# Patient Record
Sex: Female | Born: 1990
Health system: Southern US, Community
[De-identification: ages and names within clinical notes are randomized; demographics above are authoritative.]

## PROBLEM LIST (undated history)

## (undated) DIAGNOSIS — R5383 Other fatigue: Secondary | ICD-10-CM

## (undated) DIAGNOSIS — E559 Vitamin D deficiency, unspecified: Secondary | ICD-10-CM

## (undated) HISTORY — DX: Other fatigue: R53.83

## (undated) HISTORY — PX: TONSILLECTOMY: SUR1361

## (undated) HISTORY — DX: Vitamin D deficiency, unspecified: E55.9

---

## 2017-08-02 LAB — HM PAP SMEAR: HM Pap smear: NEGATIVE

## 2017-11-11 ENCOUNTER — Ambulatory Visit (INDEPENDENT_AMBULATORY_CARE_PROVIDER_SITE_OTHER): Payer: No Typology Code available for payment source | Admitting: Physician Assistant

## 2017-11-11 ENCOUNTER — Encounter: Payer: Self-pay | Admitting: Physician Assistant

## 2017-11-11 VITALS — BP 113/74 | HR 76 | Ht 64.0 in | Wt 173.0 lb

## 2017-11-11 DIAGNOSIS — E559 Vitamin D deficiency, unspecified: Secondary | ICD-10-CM | POA: Insufficient documentation

## 2017-11-11 DIAGNOSIS — R5383 Other fatigue: Secondary | ICD-10-CM | POA: Diagnosis not present

## 2017-11-11 DIAGNOSIS — Z130 Encounter for screening for diseases of the blood and blood-forming organs and certain disorders involving the immune mechanism: Secondary | ICD-10-CM

## 2017-11-11 DIAGNOSIS — Z7689 Persons encountering health services in other specified circumstances: Secondary | ICD-10-CM

## 2017-11-11 DIAGNOSIS — Z1329 Encounter for screening for other suspected endocrine disorder: Secondary | ICD-10-CM

## 2017-11-11 DIAGNOSIS — L700 Acne vulgaris: Secondary | ICD-10-CM | POA: Insufficient documentation

## 2017-11-11 MED ORDER — MINOCYCLINE HCL 100 MG PO CAPS: 100.0000 mg | ORAL_CAPSULE | Freq: Two times a day (BID) | ORAL | 1 refills | Status: DC

## 2017-11-11 MED ORDER — TAZAROTENE 0.1 % EX GEL: Freq: Every day | CUTANEOUS | 3 refills | Status: DC

## 2017-11-11 NOTE — Patient Instructions (Signed)

## 2017-11-11 NOTE — Progress Notes (Signed)
HPI:                                                                Rhonda Rojas is a 27 y.o. female who presents to [facility] today to establish care  Current concerns: fatigue, acne  For several months has felt low energy, especially after work. Sleeps 8 hours per night, sometimes feels rested. Reports history of vitamin D deficiency. Denies history of anemia, but does report history of heavy periods for which she takes birth control. Denies depressed mood, anhedonia, anxiety. No fever, chills, nightsweats, unintended weight loss.  Does not currently exercise. Drinks 1 coffee and 1 green tea per day.  Works 30 hours per week and is full-time mom to 55 yo daughter and 71 yo son.  She also c/o facial acne. States she has tried antibiotics, oral and topical, in the past, but it did not help. She is currently only taking an OCP which is only mildly helpful. Requests referral to dermatology.  Depression screen PHQ 2/9 11/11/2017  Decreased Interest 0  Down, Depressed, Hopeless 0  PHQ - 2 Score 0    No flowsheet data found.    Past Medical History:  Diagnosis Date  . Fatigue   . Vitamin D deficiency    Past Surgical History:  Procedure Laterality Date  . TONSILLECTOMY     Social History   Tobacco Use  . Smoking status: Never Smoker  . Smokeless tobacco: Never Used  Substance Use Topics  . Alcohol use: Not Currently   family history includes Healthy in her father and mother.    ROS: negative except as noted in the HPI  Medications: Current Outpatient Medications  Medication Sig Dispense Refill  . TRI-SPRINTEC 0.18/0.215/0.25 MG-35 MCG tablet Take 1 tablet by mouth daily.  4  . minocycline (MINOCIN,DYNACIN) 100 MG capsule Take 1 capsule (100 mg total) by mouth 2 (two) times daily. 180 capsule 1  . tazarotene (TAZORAC) 0.1 % gel Apply topically at bedtime. 30 g 3   No current facility-administered medications  for this visit.    Not on File     Objective:  BP 113/74   Pulse 76   Ht 5\' 4"  (1.626 m)   Wt 173 lb (78.5 kg)   LMP 10/27/2017   BMI 29.70 kg/m  Gen:  alert, not ill-appearing, no distress, appropriate for age HEENT: head normocephalic without obvious abnormality, conjunctiva and cornea clear, trachea midline, thyroid not enlarged, no tenderness Pulm: Normal work of breathing, normal phonation, clear to auscultation bilaterally, no wheezes, rales or rhonchi CV: Normal rate, regular rhythm, s1 and s2 distinct, no murmurs, clicks or rubs  Neuro: alert and oriented x 3, no tremor MSK: extremities atraumatic, normal gait and station Skin: moderate cystic facial acne Psych: well-groomed, cooperative, good eye contact, euthymic mood, affect mood-congruent, speech is articulate, and thought processes clear and goal-directed    No results found for this or any previous visit (from the past 72 hour(s)). No results found.    Assessment and Plan: 27 y.o. female with   - Personally reviewed PMH, PSH, PFH, medications, allergies, HM - Age-appropriate cancer screening: Pap UTD, requesting records from [facility] - Influenza UTD per patient - Tdap per patient -  PHQ2 negative  .Alyene was seen today for establish care.  Diagnoses and all orders for this visit:  Encounter to establish care  Vitamin D deficiency -     Vit D  25 hydroxy (rtn osteoporosis monitoring)  Fatigue, unspecified type -     CBC -     Comprehensive metabolic panel -     Ferritin -     TSH -     Vit D  25 hydroxy (rtn osteoporosis monitoring)  Screening for thyroid disorder -     TSH  Screening for blood disease -     CBC -     Comprehensive metabolic panel -     Ferritin  Cystic acne vulgaris -     Discontinue: minocycline (MINOCIN,DYNACIN) 100 MG capsule; Take 1 capsule (100 mg total) by mouth 2 (two) times daily. -     Discontinue: tazarotene (TAZORAC) 0.1 % gel; Apply topically at bedtime. -      minocycline (MINOCIN,DYNACIN) 100 MG capsule; Take 1 capsule (100 mg total) by mouth 2 (two) times daily. -     tazarotene (TAZORAC) 0.1 % gel; Apply topically at bedtime. -     Ambulatory referral to Dermatology   Fatigue: We discussed that fatigue is often multifactorial, and there is often no underlying medical cause.  We will check CBC, ferritin, TSH, vitamin D to rule out reversible causes. She has no symptoms of mood disorder or anxiety.  Encouraged her to manage with lifestyle changes including reducing caffeine, sleep hygiene, and regular aerobic exercise.  Also recommended she continue on vitamin D supplement.   Patient educat had a seated tingling all the way down ion and anticipatory guidance given Patient agrees with treatment plan Follow-up as needed if symptoms worsen or fail to improve  Levonne Hubert PA-C

## 2017-11-12 LAB — COMPREHENSIVE METABOLIC PANEL
AG RATIO: 1.6 (calc) (ref 1.0–2.5)
ALT: 11 U/L (ref 6–29)
AST: 11 U/L (ref 10–30)
Albumin: 4.2 g/dL (ref 3.6–5.1)
Alkaline phosphatase (APISO): 39 U/L (ref 33–115)
BILIRUBIN TOTAL: 0.4 mg/dL (ref 0.2–1.2)
BUN: 9 mg/dL (ref 7–25)
CALCIUM: 9.2 mg/dL (ref 8.6–10.2)
CHLORIDE: 102 mmol/L (ref 98–110)
CO2: 27 mmol/L (ref 20–32)
Creat: 0.57 mg/dL (ref 0.50–1.10)
GLOBULIN: 2.7 g/dL (ref 1.9–3.7)
Glucose, Bld: 81 mg/dL (ref 65–99)
POTASSIUM: 4 mmol/L (ref 3.5–5.3)
SODIUM: 137 mmol/L (ref 135–146)
TOTAL PROTEIN: 6.9 g/dL (ref 6.1–8.1)

## 2017-11-12 LAB — CBC
HEMATOCRIT: 34 % — AB (ref 35.0–45.0)
HEMOGLOBIN: 11.7 g/dL (ref 11.7–15.5)
MCH: 28.8 pg (ref 27.0–33.0)
MCHC: 34.4 g/dL (ref 32.0–36.0)
MCV: 83.7 fL (ref 80.0–100.0)
MPV: 11.2 fL (ref 7.5–12.5)
Platelets: 230 10*3/uL (ref 140–400)
RBC: 4.06 10*6/uL (ref 3.80–5.10)
RDW: 13.7 % (ref 11.0–15.0)
WBC: 7.3 10*3/uL (ref 3.8–10.8)

## 2017-11-12 LAB — FERRITIN: Ferritin: 12 ng/mL — ABNORMAL LOW (ref 16–154)

## 2017-11-12 LAB — VITAMIN D 25 HYDROXY (VIT D DEFICIENCY, FRACTURES): Vit D, 25-Hydroxy: 37 ng/mL (ref 30–100)

## 2017-11-12 LAB — TSH: TSH: 2.52 mIU/L

## 2017-11-13 ENCOUNTER — Encounter: Payer: Self-pay | Admitting: Physician Assistant

## 2018-03-02 MED FILL — MYORISAN 40 MG CAPSULE: 40 | 30 days supply | Qty: 30 | Fill #0

## 2018-04-05 MED FILL — MYORISAN 40 MG CAPSULE: 40 | 30 days supply | Qty: 30 | Fill #0

## 2018-05-09 MED FILL — MYORISAN 40 MG CAPSULE: 40 | 30 days supply | Qty: 30 | Fill #0

## 2018-06-08 MED FILL — MYORISAN 40 MG CAPSULE: 40 | 30 days supply | Qty: 30 | Fill #0

## 2018-06-23 ENCOUNTER — Encounter: Payer: Self-pay | Admitting: Physician Assistant

## 2018-06-23 ENCOUNTER — Ambulatory Visit (INDEPENDENT_AMBULATORY_CARE_PROVIDER_SITE_OTHER): Payer: No Typology Code available for payment source | Admitting: Physician Assistant

## 2018-06-23 VITALS — BP 111/74 | HR 66 | Temp 98.9°F | Wt 172.0 lb

## 2018-06-23 DIAGNOSIS — Z3041 Encounter for surveillance of contraceptive pills: Secondary | ICD-10-CM

## 2018-06-23 DIAGNOSIS — R790 Abnormal level of blood mineral: Secondary | ICD-10-CM

## 2018-06-23 DIAGNOSIS — R030 Elevated blood-pressure reading, without diagnosis of hypertension: Secondary | ICD-10-CM

## 2018-06-23 DIAGNOSIS — Z0000 Encounter for general adult medical examination without abnormal findings: Secondary | ICD-10-CM | POA: Diagnosis not present

## 2018-06-23 DIAGNOSIS — E663 Overweight: Secondary | ICD-10-CM

## 2018-06-23 MED ORDER — DESOGESTREL-ETHINYL ESTRADIOL 0.15-0.02/0.01 MG (21/5) PO TABS: 1.0000 | ORAL_TABLET | Freq: Every day | ORAL | 4 refills | Status: DC

## 2018-06-23 NOTE — Progress Notes (Signed)
HPI:                                                                Rhonda Rojas is a 28 y.o. female who presents to [facility] [place] today for annual physical exam  Current Concerns include: refill OCP  She is walking for 30 minutes 6 days/week  GYN/Sexual Health  Obstetrics: G2P2  Menstrual status: having periods Patient's last menstrual period was 06/23/2018 (exact date).  Menses: regular/OCP  Last pap smear: 07/28/17, normal per patient  History of abnormal pap smears: no  Sexually active: 1 female partner (Husband)  Current contraception: Tri-Sprintec  History of STI: never  Depression screen [facility] 2/9 06/23/2018 11/11/2017  Decreased Interest 0 0  Down, Depressed, Hopeless 0 0  PHQ - 2 Score 0 0     Health Maintenance Health Maintenance  Topic Date Due  . HIV Screening  06/25/2004  . INFLUENZA VACCINE  08/05/2018  . PAP-Cervical Cytology Screening  07/28/2020  . TETANUS/TDAP  11/12/2023  . PAP SMEAR-Modifier  Discontinued    Past Medical History:  Diagnosis Date  . Fatigue   . Vitamin D deficiency    Past Surgical History:  Procedure Laterality Date  . TONSILLECTOMY     Social History   Tobacco Use  . Smoking status: Never Smoker  . Smokeless tobacco: Never Used  Substance Use Topics  . Alcohol use: Not Currently   family history includes Healthy in her father and mother.  ROS: negative except as noted in the HPI  Medications: Current Outpatient Medications  Medication Sig Dispense Refill  . MYORISAN 40 MG capsule Take 1 capsule by mouth daily.    Marland Kitchen desogestrel-ethinyl estradiol (MIRCETTE) 0.15-0.02/0.01 MG (21/5) tablet Take 1 tablet by mouth daily. 3 Package 4   No current facility-administered medications for this visit.    No Known Allergies     Objective:  BP 111/74   Pulse 66   Temp 98.9 F (37.2 C) (Oral)   Wt 172 lb (78 kg)   LMP 06/23/2018 (Exact Date)   BMI 29.52 kg/m   Vitals:   06/23/18 1344 06/23/18 1403  BP: 121/81 111/74  Pulse: 75 66  Temp: 98.9 F (37.2 C)     General Appearance:  Alert, cooperative, no distress, appropriate for age                            Head:  Normocephalic, without obvious abnormality                             Eyes:  PERRL, EOM's intact, conjunctiva and cornea clear                             Ears:  TM pearly gray color and semitransparent, external ear canals normal, both ears                            Nose:  Nares symmetrical, mucosa pink  Throat:  Lips, tongue, and mucosa are moist, pink, and intact; oropharynx clear, uvula midline; good dentition                             Neck:  Supple; symmetrical, trachea midline, no adenopathy; thyroid: no enlargement, symmetric, no tenderness/mass/nodules                             Back:  Symmetrical, no curvature, ROM normal               Chest/Breast:  No tenderness, masses or nipple abnormality. No dimpling or discharge                           Lungs:  Clear to auscultation bilaterally, respirations unlabored                             Heart:  normal rate & regular rhythm, S1 and S2 normal, no murmurs, rubs, or gallops                     Abdomen:  Soft, non-tender, no mass or organomegaly              Genitourinary:  deferred         Musculoskeletal:  Tone and strength strong and symmetrical, all extremities; no joint pain or edema, normal gait and station                                   Lymphatic:  No adenopathy             Skin/Hair/Nails:  Skin warm, dry and intact, no rashes, bilateral hyperpigmentation of the axilla                   Neurologic:  Alert and oriented x3, no cranial nerve deficits sensation grossly intact, normal gait and station, no tremor Psych: well-groomed, cooperative, good eye contact, euthymic mood, affect mood-congruent, speech is articulate, and thought processes clear and goal-directed  BP Readings from Last 3  Encounters:  06/23/18 111/74  11/11/17 113/74   Wt Readings from Last 3 Encounters:  06/23/18 172 lb (78 kg)  11/11/17 173 lb (78.5 kg)   Lab Results  Component Value Date   CREATININE 0.57 11/11/2017   BUN 9 11/11/2017   NA 137 11/11/2017   K 4.0 11/11/2017   CL 102 11/11/2017   CO2 27 11/11/2017   Lab Results  Component Value Date   ALT 11 11/11/2017   AST 11 11/11/2017   BILITOT 0.4 11/11/2017   Lab Results  Component Value Date   WBC 7.3 11/11/2017   HGB 11.7 11/11/2017   HCT 34.0 (L) 11/11/2017   MCV 83.7 11/11/2017   PLT 230 11/11/2017     Assessment and Plan: 28 y.o. female with   .Rhonda Rojas was seen today for annual exam.  Diagnoses and all orders for this visit:  Encounter for annual physical exam  Low serum ferritin level -     CBC -     Fe+TIBC+Fer  Encounter for surveillance of contraceptive pills -     desogestrel-ethinyl estradiol (MIRCETTE) 0.15-0.02/0.01 MG (21/5) tablet; Take 1 tablet by mouth daily.  Elevated blood pressure reading  Overweight (BMI 25.0-29.9)   - Personally reviewed PMH, PSH, PFH, medications, allergies, HM - Age-appropriate cancer screening: Pap UTD per patient, requesting record from [facility] - Tdap UTD - PHQ2 negative - BMI 29 - counseled on weight loss through decreased caloric intake and increased aerobic exercise - Declined fasting lipids, CMP completed 6 months ago and normal, recheck iron panel and CBC today  Elevated BP reading Improved on recheck Switched from Tri-Sprintec to Mircette Follow-up in 3 months  Patient education and anticipatory guidance given Patient agrees with treatment plan Follow-up in 3 months for OCP/BP check or sooner as needed  Levonne Hubertharley E. Leniyah Martell PA-C

## 2018-06-23 NOTE — Patient Instructions (Signed)
Oral Contraception Information Oral contraceptive pills (OCPs) are medicines taken to prevent pregnancy. OCPs are taken by mouth, and they work by:  Preventing the ovaries from releasing eggs.  Thickening mucus in the lower part of the uterus (cervix), which prevents sperm from entering the uterus.  Thinning the lining of the uterus (endometrium), which prevents a fertilized egg from attaching to the endometrium. OCPs are highly effective when taken exactly as prescribed. However, OCPs do not prevent STIs (sexually transmitted infections). Safe sex practices, such as using condoms while on an OCP, can help prevent STIs. Before starting OCPs Before you start taking OCPs, you may have a physical exam, blood test, and Pap test. However, you are not required to have a pelvic exam in order to be prescribed OCPs. Your health care provider will make sure you are a good candidate for oral contraception. OCPs are not a good option for certain women, including women who smoke and are older than 35 years, and women with a medical history of high blood pressure, deep vein thrombosis, pulmonary embolism, stroke, cardiovascular disease, or peripheral vascular disease. Discuss with your health care provider the possible side effects of the OCP you may be prescribed. When you start an OCP, be aware that it can take 2-3 months for your body to adjust to changes in hormone levels. Follow instructions from your health care provider about how to start taking your first cycle of OCPs. Depending on when you start the pill, you may need to use a backup form of birth control, such as condoms, during the first week. Make sure you know what steps to take if you ever forget to take the pill. Types of oral contraception  The most common types of birth control pills contain the hormones estrogen and progestin (synthetic progesterone) or progestin only. The combination pill This type of pill contains estrogen and progestin  hormones. Combination pills often come in packs of 21, 28, or 91 pills. For each pack, the last 7 pills may not contain hormones, which means you may stop taking the pills for 7 days. Menstrual bleeding occurs during the week that you do not take the pills or that you take the pills with no hormones in them. The minipill This type of pill contains the progestin hormone only. It comes in packs of 28 pills. All 28 pills contain the hormone. You take the pill every day. It is very important to take the pill at the same time each day. Advantages of oral contraceptive pills  Provides reliable and continuous contraception if taken as instructed.  May treat or decrease symptoms of: ? Menstrual period cramps. ? Irregular menstrual cycle or bleeding. ? Heavy menstrual flow. ? Abnormal uterine bleeding. ? Acne, depending on the type of pill. ? Polycystic ovarian syndrome. ? Endometriosis. ? Iron deficiency anemia. ? Premenstrual symptoms, including premenstrual dysphoric disorder.  May reduce the risk of endometrial and ovarian cancer.  Can be used as emergency contraception.  Prevents mislocated (ectopic) pregnancies and infections of the fallopian tubes. Things that can make oral contraceptive pills less effective OCPs can be less effective if:  You forget to take the pill at the same time every day. This is especially important when taking the minipill.  You have a stomach or intestinal disease that reduces your body's ability to absorb the pill.  You take OCPs with other medicines that make OCPs less effective, such as antibiotics, certain HIV medicines, and some seizure medicines.  You take expired OCPs.  You forget to restart the pill on day 7, if using the packs of 21 pills. Risks associated with oral contraceptive pills Oral contraceptive pills can sometimes cause side effects, such as:  Headache.  Depression.  Trouble sleeping.  Nausea and vomiting.  Breast tenderness.   Irregular bleeding or spotting during the first several months.  Bloating or fluid retention.  Increase in blood pressure. Combination pills are also associated with a small increase in the risk of:  Blood clots.  Heart attack.  Stroke. Summary  Oral contraceptive pills are medicines taken by mouth to prevent pregnancy. They are highly effective when taken exactly as prescribed.  The most common types of birth control pills contain the hormones estrogen and progestin (synthetic progesterone) or progestin only.  Before you start taking the pill, you may have a physical exam, blood test, and Pap test. Your health care provider will make sure you are a good candidate for oral contraception.  The combination pill may come in a 21-day pack, a 28-day pack, or a 91-day pack. The minipill contains the progesterone hormone only and comes in packs of 28 pills.  Oral contraceptive pills can sometimes cause side effects, such as headache, nausea, breast tenderness, or irregular bleeding. This information is not intended to replace advice given to you by your health care provider. Make sure you discuss any questions you have with your health care provider. Document Released: 03/13/2002 Document Revised: 03/16/2016 Document Reviewed: 03/16/2016 Elsevier Interactive Patient Education  2019 [place] 18-39 Years, Female Preventive care refers to lifestyle choices and visits with your health care provider that can promote health and wellness. What does preventive care include?   A yearly physical exam. This is also called an annual well check.  Dental exams once or twice a year.  Routine eye exams. Ask your health care provider how often you should have your eyes checked.  Personal lifestyle choices, including: ? Daily care of your teeth and gums. ? Regular physical activity. ? Eating a healthy diet. ? Avoiding tobacco and drug use. ? Limiting alcohol use. ?  Practicing safe sex. ? Taking vitamin and mineral supplements as recommended by your health care provider. What happens during an annual well check? The services and screenings done by your health care provider during your annual well check will depend on your age, overall health, lifestyle risk factors, and family history of disease. Counseling Your health care provider may ask you questions about your:  Alcohol use.  Tobacco use.  Drug use.  Emotional well-being.  Home and relationship well-being.  Sexual activity.  Eating habits.  Work and work Statistician.  Method of birth control.  Menstrual cycle.  Pregnancy history. Screening You may have the following tests or measurements:  Height, weight, and BMI.  Diabetes screening. This is done by checking your blood sugar (glucose) after you have not eaten for a while (fasting).  Blood pressure.  Lipid and cholesterol levels. These may be checked every 5 years starting at age 4.  Skin check.  Hepatitis C blood test.  Hepatitis B blood test.  Sexually transmitted disease (STD) testing.  BRCA-related cancer screening. This may be done if you have a family history of breast, ovarian, tubal, or peritoneal cancers.  Pelvic exam and Pap test. This may be done every 3 years starting at age 92. Starting at age 64, this may be done every 5 years if you have a Pap test in combination with an HPV test. Discuss  your test results, treatment options, and if necessary, the need for more tests with your health care provider. Vaccines Your health care provider may recommend certain vaccines, such as:  Influenza vaccine. This is recommended every year.  Tetanus, diphtheria, and acellular pertussis (Tdap, Td) vaccine. You may need a Td booster every 10 years.  Varicella vaccine. You may need this if you have not been vaccinated.  HPV vaccine. If you are 32 or younger, you may need three doses over 6 months.  Measles, mumps,  and rubella (MMR) vaccine. You may need at least one dose of MMR. You may also need a second dose.  Pneumococcal 13-valent conjugate (PCV13) vaccine. You may need this if you have certain conditions and were not previously vaccinated.  Pneumococcal polysaccharide (PPSV23) vaccine. You may need one or two doses if you smoke cigarettes or if you have certain conditions.  Meningococcal vaccine. One dose is recommended if you are age 57-21 years and a first-year college student living in a residence hall, or if you have one of several medical conditions. You may also need additional booster doses.  Hepatitis A vaccine. You may need this if you have certain conditions or if you travel or work in places where you may be exposed to hepatitis A.  Hepatitis B vaccine. You may need this if you have certain conditions or if you travel or work in places where you may be exposed to hepatitis B.  Haemophilus influenzae type b (Hib) vaccine. You may need this if you have certain risk factors. Talk to your health care provider about which screenings and vaccines you need and how often you need them. This information is not intended to replace advice given to you by your health care provider. Make sure you discuss any questions you have with your health care provider. Document Released: 02/16/2001 Document Revised: 08/03/2016 Document Reviewed: 10/22/2014 Elsevier Interactive Patient Education  2019 [employer].

## 2018-07-01 LAB — CBC
HCT: 34.9 % — ABNORMAL LOW (ref 35.0–45.0)
Hemoglobin: 11.7 g/dL (ref 11.7–15.5)
MCH: 29.2 pg (ref 27.0–33.0)
MCHC: 33.5 g/dL (ref 32.0–36.0)
MCV: 87 fL (ref 80.0–100.0)
MPV: 11.3 fL (ref 7.5–12.5)
Platelets: 288 10*3/uL (ref 140–400)
RBC: 4.01 10*6/uL (ref 3.80–5.10)
RDW: 13.7 % (ref 11.0–15.0)
WBC: 6.4 10*3/uL (ref 3.8–10.8)

## 2018-07-01 LAB — IRON,TIBC AND FERRITIN PANEL
%SAT: 9 % (calc) — ABNORMAL LOW (ref 16–45)
Ferritin: 5 ng/mL — ABNORMAL LOW (ref 16–154)
Iron: 43 ug/dL (ref 40–190)
TIBC: 462 mcg/dL (calc) — ABNORMAL HIGH (ref 250–450)

## 2018-07-04 MED FILL — PIMTREA 28 DAY TABLET: 0.15-0.02/0 | 84 days supply | Qty: 84 | Fill #0

## 2018-07-13 ENCOUNTER — Encounter: Payer: Self-pay | Admitting: Physician Assistant

## 2018-08-10 ENCOUNTER — Telehealth: Payer: No Typology Code available for payment source | Admitting: Physician Assistant

## 2018-08-10 DIAGNOSIS — R8290 Unspecified abnormal findings in urine: Secondary | ICD-10-CM

## 2018-08-10 DIAGNOSIS — R350 Frequency of micturition: Secondary | ICD-10-CM | POA: Diagnosis not present

## 2018-08-10 MED ORDER — NITROFURANTOIN MONOHYD MACRO 100 MG PO CAPS: 100.0000 mg | ORAL_CAPSULE | Freq: Two times a day (BID) | ORAL | 0 refills | Status: AC

## 2018-08-10 MED FILL — NITROFURANTOIN MONO-MCR 100: 100 | 5 days supply | Qty: 10 | Fill #0

## 2018-08-10 NOTE — Progress Notes (Signed)
We are sorry that you are not feeling well.  Here is how we plan to help!  Based on what you shared with me it looks like you most likely have a simple urinary tract infection.  A UTI (Urinary Tract Infection) is a bacterial infection of the bladder.  Most cases of urinary tract infections are simple to treat but a key part of your care is to encourage you to drink plenty of fluids and watch your symptoms carefully.  I have prescribed MacroBid 100 mg twice a day for 5 days.  Your symptoms should gradually improve. Call us if the burning in your urine worsens, you develop worsening fever, back pain or pelvic pain or if your symptoms do not resolve after completing the antibiotic.  Urinary tract infections can be prevented by drinking plenty of water to keep your body hydrated.  Also be sure when you wipe, wipe from front to back and don't hold it in!  If possible, empty your bladder every 4 hours.  Your e-visit answers were reviewed by a board certified advanced clinical practitioner to complete your personal care plan.  Depending on the condition, your plan could have included both over the counter or prescription medications.  If there is a problem please reply  once you have received a response from your provider.  Your safety is important to us.  If you have drug allergies check your prescription carefully.    You can use MyChart to ask questions about today's visit, request a non-urgent call back, or ask for a work or school excuse for 24 hours related to this e-Visit. If it has been greater than 24 hours you will need to follow up with your provider, or enter a new e-Visit to address those concerns.   You will get an e-mail in the next two days asking about your experience.  I hope that your e-visit has been valuable and will speed your recovery. Thank you for using e-visits.  I have spent 5 minutes in review of e-visit questionnaire, review and updating patient chart, medical decision  making and response to patient.    Alanni Vader, PA-C    

## 2018-08-11 ENCOUNTER — Ambulatory Visit: Payer: No Typology Code available for payment source | Admitting: Physician Assistant

## 2018-09-22 ENCOUNTER — Ambulatory Visit (INDEPENDENT_AMBULATORY_CARE_PROVIDER_SITE_OTHER): Payer: No Typology Code available for payment source | Admitting: Physician Assistant

## 2018-09-22 ENCOUNTER — Other Ambulatory Visit: Payer: Self-pay

## 2018-09-22 ENCOUNTER — Encounter: Payer: Self-pay | Admitting: Physician Assistant

## 2018-09-22 VITALS — BP 104/72 | HR 71 | Temp 98.7°F | Wt 165.0 lb

## 2018-09-22 DIAGNOSIS — Z23 Encounter for immunization: Secondary | ICD-10-CM | POA: Diagnosis not present

## 2018-09-22 DIAGNOSIS — Z3041 Encounter for surveillance of contraceptive pills: Secondary | ICD-10-CM

## 2018-09-22 DIAGNOSIS — R790 Abnormal level of blood mineral: Secondary | ICD-10-CM | POA: Diagnosis not present

## 2018-09-22 NOTE — Progress Notes (Signed)
HPI:                                                                Rhonda Rojas is a 28 y.o. female who presents to [facility] today for OCP/BP follow-up  She was switched to Mircette 3 months ago due to elevated BP.  She reports no issues with the new OCP. No breakthrough bleeding. No headaches. She discontinued Isotretinoin 2 months ago and reports no issues with her acne.  She has been taking iron supplement every other day. Denies fatigue, dyspnea, palpitations.  Depression screen [facility] 2/9 06/23/2018 11/11/2017  Decreased Interest 0 0  Down, Depressed, Hopeless 0 0  PHQ - 2 Score 0 0       Past Medical History:  Diagnosis Date  . Fatigue   . Vitamin D deficiency    Past Surgical History:  Procedure Laterality Date  . TONSILLECTOMY     Social History   Tobacco Use  . Smoking status: Never Smoker  . Smokeless tobacco: Never Used  Substance Use Topics  . Alcohol use: Not Currently   family history includes Healthy in her father and mother.    ROS: negative except as noted in the HPI  Medications: Current Outpatient Medications  Medication Sig Dispense Refill  . desogestrel-ethinyl estradiol (MIRCETTE) 0.15-0.02/0.01 MG (21/5) tablet Take 1 tablet by mouth daily. 3 Package 4  . MYORISAN 40 MG capsule Take 1 capsule by mouth daily.     No current facility-administered medications for this visit.    No Known Allergies     Objective:  BP 104/72   Pulse 71   Temp 98.7 F (37.1 C) (Oral)   Wt 165 lb (74.8 kg)   SpO2 98%   BMI 28.32 kg/m   Wt Readings from Last 3 Encounters:  09/22/18 165 lb (74.8 kg)  06/23/18 172 lb (78 kg)  11/11/17 173 lb (78.5 kg)   Temp Readings from Last 3 Encounters:  09/22/18 98.7 F (37.1 C) (Oral)  06/23/18 98.9 F (37.2 C) (Oral)   BP Readings from Last 3 Encounters:  09/22/18 104/72  06/23/18 111/74  11/11/17 113/74   Pulse Readings from Last 3 Encounters:   09/22/18 71  06/23/18 66  11/11/17 76    Gen:  alert, not ill-appearing, no distress, appropriate for age 66: head normocephalic without obvious abnormality, conjunctiva and cornea clear, trachea midline Pulm: Normal work of breathing, normal phonation Neuro: alert and oriented x 3 Psych: cooperative, euthymic mood, affect mood-congruent, speech is articulate, normal rate and volume; thought processes clear and goal-directed, normal judgment, good insight   Lab Results  Component Value Date   CREATININE 0.57 11/11/2017   BUN 9 11/11/2017   NA 137 11/11/2017   K 4.0 11/11/2017   CL 102 11/11/2017   CO2 27 11/11/2017    Lab Results  Component Value Date   WBC 6.4 06/30/2018   HGB 11.7 06/30/2018   HCT 34.9 (L) 06/30/2018   MCV 87.0 06/30/2018   PLT 288 06/30/2018   Lab Results  Component Value Date   IRON 43 06/30/2018   TIBC 462 (H) 06/30/2018   FERRITIN 5 (L) 06/30/2018      Assessment and Plan: 28 y.o. female with   .  Rhonda Rojas was seen today for medication management.  Diagnoses and all orders for this visit:  Encounter for surveillance of contraceptive pills  Need for immunization against influenza -     Flu Vaccine QUAD 36+ mos IM  Low serum ferritin level -     Fe+TIBC+Fer -     BASIC METABOLIC PANEL WITH GFR -     Hemoglobin and hematocrit, blood  Surveillance OCP BP normotensive No AE No contraindications to OCP Continue Mircette  Iron deficiency without anemia Check iron panel to ensure she is absorbing PO iron Will also re-check BMP to r/o renal insufficiency as it has been nearly a year since this was checked    Patient education and anticipatory guidance given Patient agrees with treatment plan Follow-up in 9 months for CPE or sooner as needed if symptoms worsen or fail to improve  Levonne Hubertharley E. Cummings PA-C

## 2018-09-23 LAB — HEMOGLOBIN AND HEMATOCRIT, BLOOD
HCT: 37.5 % (ref 35.0–45.0)
Hemoglobin: 12.9 g/dL (ref 11.7–15.5)

## 2018-09-23 LAB — BASIC METABOLIC PANEL WITH GFR
BUN: 11 mg/dL (ref 7–25)
CO2: 24 mmol/L (ref 20–32)
Calcium: 9.4 mg/dL (ref 8.6–10.2)
Chloride: 104 mmol/L (ref 98–110)
Creat: 0.66 mg/dL (ref 0.50–1.10)
GFR, Est African American: 138 mL/min/{1.73_m2} (ref >=60)
GFR, Est Non African American: 119 mL/min/{1.73_m2} (ref >=60)
Glucose, Bld: 99 mg/dL (ref 65–99)
Potassium: 3.9 mmol/L (ref 3.5–5.3)
Sodium: 139 mmol/L (ref 135–146)

## 2018-09-23 LAB — IRON,TIBC AND FERRITIN PANEL
%SAT: 22 % (calc) (ref 16–45)
Ferritin: 21 ng/mL (ref 16–154)
Iron: 93 ug/dL (ref 40–190)
TIBC: 427 mcg/dL (calc) (ref 250–450)

## 2018-09-29 ENCOUNTER — Ambulatory Visit: Payer: No Typology Code available for payment source | Admitting: Physician Assistant

## 2019-06-29 ENCOUNTER — Encounter: Payer: No Typology Code available for payment source | Admitting: Nurse Practitioner

## 2019-07-02 ENCOUNTER — Telehealth: Payer: Self-pay

## 2019-07-02 DIAGNOSIS — Z3041 Encounter for surveillance of contraceptive pills: Secondary | ICD-10-CM

## 2019-07-02 NOTE — Telephone Encounter (Signed)
Pt scheduled for physical

## 2019-07-02 NOTE — Telephone Encounter (Signed)
Pharmacy sent request for refill on birth control, this was a Sports [employer] patient   Please call patient and let her know she must schedule appt to establish with new PCP prior to [place] sending refills. Once scheduled, can sent in 30 day supply if needed.

## 2019-07-04 MED ORDER — DESOGESTREL-ETHINYL ESTRADIOL 0.15-0.02/0.01 MG (21/5) PO TABS: 1.0000 | ORAL_TABLET | Freq: Every day | ORAL | 0 refills | Status: DC

## 2019-07-06 ENCOUNTER — Encounter: Payer: Self-pay | Admitting: Nurse Practitioner

## 2019-07-06 ENCOUNTER — Ambulatory Visit (INDEPENDENT_AMBULATORY_CARE_PROVIDER_SITE_OTHER): Payer: No Typology Code available for payment source | Admitting: Nurse Practitioner

## 2019-07-06 VITALS — HR 87 | Temp 98.0°F | Ht 64.5 in | Wt 178.3 lb

## 2019-07-06 DIAGNOSIS — R221 Localized swelling, mass and lump, neck: Secondary | ICD-10-CM

## 2019-07-06 DIAGNOSIS — L709 Acne, unspecified: Secondary | ICD-10-CM

## 2019-07-06 DIAGNOSIS — Z3041 Encounter for surveillance of contraceptive pills: Secondary | ICD-10-CM

## 2019-07-06 DIAGNOSIS — Z0000 Encounter for general adult medical examination without abnormal findings: Secondary | ICD-10-CM | POA: Diagnosis not present

## 2019-07-06 MED ORDER — DESOGESTREL-ETHINYL ESTRADIOL 0.15-0.02/0.01 MG (21/5) PO TABS: 1.0000 | ORAL_TABLET | Freq: Every day | ORAL | 12 refills | Status: DC

## 2019-07-06 NOTE — Progress Notes (Signed)
Established Patient Office Visit  Subjective:  Patient ID: Rhonda Rojas, female    DOB: [birth year]  Age: 29 y.o. MRN: [id number]  CC:  Chief Complaint  Patient presents with  . Establish Care    former Rhonda Rojas, [place] patient  . Annual Exam    HPI Rhonda Rojas is a healthy 29 year old female presenting today for her annual physical exam. She is a former patient of Rhonda Fray, PA-c. She reports she is overall healthy and denies any new health concerns today. She would like a refill on her birth control as she is out. She is also requesting a referral to dermatology due to increased acne on the lower portion of her face, mostly where her mask lies.   She reports regular vision exams with [facility]. She is a contact lens wearer She reports regular dental exams with [facility] in [place]. She states that her diet has not been great recently and that she has gained some weight, but she is working on improving her diet and drinking more water.  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 She is a non-smoker and does not use recreational drugs She drinks only on occasion She is currently sexually active in a monogamous relationship- she denies new sexual partners or exposure to STI's She uses OCP as her birth control method Her menstrual cycles are regular and normal in flow and length She does have increased acne changes to her face and a new nodule in the left submental region. She reports the nodule is non-tender and she first noticed it earlier this week.  She is due for her next Pap test and her first HPV screening in 2022  Past Medical History:  Diagnosis Date  . Fatigue   . Vitamin D deficiency     Past Surgical History:  Procedure Laterality Date  . TONSILLECTOMY      Family History  Problem Relation Age of Onset  . Healthy Mother   . Healthy Father     Social History    Socioeconomic History  . Marital status: Married    Spouse name: Not on file  . Number of children: Not on file  . Years of education: Not on file  . Highest education level: Not on file  Occupational History  . Not on file  Tobacco Use  . Smoking status: Never Smoker  . Smokeless tobacco: Never Used  Vaping Use  . Vaping Use: Never used  Substance and Sexual Activity  . Alcohol use: Yes    Comment: Occasionally  . Drug use: Never  . Sexual activity: Yes    Partners: Male  Other Topics Concern  . Not on file  Social History Narrative  . Not on file   Social Determinants of Health   Financial Resource Strain:   . Difficulty of Paying Living Expenses:   Food Insecurity:   . Worried About Programme researcher, broadcasting/film/video in the Last Year:   . Barista in the Last Year:   Transportation Needs:   . Freight forwarder (Medical):   Marland Kitchen Lack of Transportation (Non-Medical):   Physical Activity:   . Days of Exercise per Week:   . Minutes of Exercise per Session:   Stress:   . Feeling of Stress :   Social Connections:   . Frequency of Communication with Friends and Family:   . Frequency of Social Gatherings with  Friends and Family:   . Attends Religious Services:   . Active Member of Clubs or Organizations:   . Attends Banker Meetings:   Marland Kitchen Marital Status:   Intimate Partner Violence:   . Fear of Current or Ex-Partner:   . Emotionally Abused:   Marland Kitchen Physically Abused:   . Sexually Abused:     Outpatient Medications Prior to Visit  Medication Sig Dispense Refill  . desogestrel-ethinyl estradiol (MIRCETTE) 0.15-0.02/0.01 MG (21/5) tablet Take 1 tablet by mouth daily. 28 tablet 0  . ferrous sulfate 325 (65 FE) MG tablet 325 mg. 1-2 times daily with a meal on M, W, F    . vitamin B-12 (CYANOCOBALAMIN) 500 MCG tablet Take 500 mcg by mouth daily.     No facility-administered medications prior to visit.    No Known Allergies    Objective:    Physical  Exam Vitals and nursing note reviewed.  Constitutional:      Appearance: Normal appearance.  HENT:     Head: Normocephalic.     Right Ear: Tympanic membrane normal.     Left Ear: Tympanic membrane normal.     Nose: Nose normal.     Mouth/Throat:     Mouth: Mucous membranes are moist.     Pharynx: Oropharynx is clear.  Eyes:     Extraocular Movements: Extraocular movements intact.     Conjunctiva/sclera: Conjunctivae normal.     Pupils: Pupils are equal, round, and reactive to light.  Neck:     Vascular: No carotid bruit.   Cardiovascular:     Rate and Rhythm: Normal rate and regular rhythm.     Pulses: Normal pulses.     Heart sounds: Normal heart sounds.  Pulmonary:     Effort: Pulmonary effort is normal.     Breath sounds: Normal breath sounds.  Abdominal:     General: Abdomen is flat. Bowel sounds are normal. There is no distension.     Palpations: Abdomen is soft.     Tenderness: There is no abdominal tenderness. There is no right CVA tenderness or left CVA tenderness.  Musculoskeletal:        General: Normal range of motion.     Cervical back: Normal range of motion. No rigidity or tenderness.     Right lower leg: No edema.     Left lower leg: No edema.  Lymphadenopathy:     Cervical: No cervical adenopathy.  Skin:    General: Skin is warm and dry.     Capillary Refill: Capillary refill takes less than 2 seconds.  Neurological:     General: No focal deficit present.     Mental Status: She is alert and oriented to person, place, and time.  Psychiatric:        Mood and Affect: Mood normal.        Behavior: Behavior normal.        Thought Content: Thought content normal.        Judgment: Judgment normal.     Ht 5' 4.5" (1.638 m)   Wt 178 lb 4.8 oz (80.9 kg)   LMP 06/28/2019   BMI 30.13 kg/m  Wt Readings from Last 3 Encounters:  07/06/19 178 lb 4.8 oz (80.9 kg)  09/22/18 165 lb (74.8 kg)  06/23/18 172 lb (78 kg)     Health Maintenance Due  Topic Date  Due  . Hepatitis C Screening  Never done  . HIV Screening  Never done    There  are no preventive care reminders to display for this patient.  Lab Results  Component Value Date   TSH 2.52 11/11/2017   Lab Results  Component Value Date   WBC 6.4 06/30/2018   HGB 12.9 09/22/2018   HCT 37.5 09/22/2018   MCV 87.0 06/30/2018   PLT 288 06/30/2018   Lab Results  Component Value Date   NA 139 09/22/2018   K 3.9 09/22/2018   CO2 24 09/22/2018   GLUCOSE 99 09/22/2018   BUN 11 09/22/2018   CREATININE 0.66 09/22/2018   BILITOT 0.4 11/11/2017   AST 11 11/11/2017   ALT 11 11/11/2017   PROT 6.9 11/11/2017   CALCIUM 9.4 09/22/2018   No results found for: CHOL No results found for: HDL No results found for: LDLCALC No results found for: TRIG No results found for: CHOLHDL No results found for: JIRC7E    Assessment & Plan:   1. Encounter for annual physical exam Overall healthy 29 year old female with relatively benign physical exam. Acne is present to the lower portion of the face bilaterally. Referral to dermatology provided. Non-tender, firm, nodule noted to the left submental area- suspect this is a cyst, however, encouraged the patient to continue to monitor this for increase in size, tenderness, or change to the skin. May need ultrasound of the area if any changes are detected. In need of labs today- will obtain CBC, CMP, and lipids. Will also obtain HIV and Hep C screening. Plan for pap with HPV testing next year.   PLAN: - HIV Antibody (routine testing w rflx) - Hepatitis C antibody - CBC with Differential - COMPLETE METABOLIC PANEL WITH GFR - Lipid panel - Referral to dermatology for acne - Continue to monitor nodule and notify us of any changes- ultrasound may be needed -Follow-up in 1 year of physical exam and Pap with HPV or sooner if needed.   2. Encounter for surveillance of contraceptive pills Continued use of OCP without any concerns today. Refills provided. Pap  due next year.  - desogestrel-ethinyl estradiol (MIRCETTE) 0.15-0.02/0.01 MG (21/5) tablet; Take 1 tablet by mouth daily.  Dispense: 28 tablet; Refill: 12  3. Acne, unspecified acne type Acne to the inferior portion of the face. Areas most affected appear to be under the mask line and most likely exacerbated by frequent mask wearing. Ambulatory referral to dermatology for treatment plan placed at patients request.   PLAN: - Ambulatory referral to Dermatology  4. Nodule of neck Approximate 1 cm, firm nodule. Not easily movable on palpation and no tenderness or pain produced with palpation and manipulation. Suspect this is a benign cystic lesion, however, it is a fairly new detection so it is unclear how long it has been present or if it is growing. Discussed with the patient to monitor the area for any changes, specifically in size, and to notify the office if this seems to be getting larger or other changes are detected. May need ultrasound of the area to further evaluate if this grows or becomes bothersome.   PLAN: -Watchful waiting with plan to ultrasound if growth or change occurs -Follow-up as needed .   Tollie Eth, NP

## 2019-07-06 NOTE — Patient Instructions (Addendum)
Preventive Care 21-30 Years Old, Female Preventive care refers to visits with your health care provider and lifestyle choices that can promote health and wellness. This includes:  A yearly physical exam. This may also be called an annual well check.  Regular dental visits and eye exams.  Immunizations.  Screening for certain conditions.  Healthy lifestyle choices, such as eating a healthy diet, getting regular exercise, not using drugs or products that contain nicotine and tobacco, and limiting alcohol use. What can I expect for my preventive care visit? Physical exam Your health care provider will check your:  Height and weight. This may be used to calculate body mass index (BMI), which tells if you are at a healthy weight.  Heart rate and blood pressure.  Skin for abnormal spots. Counseling Your health care provider may ask you questions about your:  Alcohol, tobacco, and drug use.  Emotional well-being.  Home and relationship well-being.  Sexual activity.  Eating habits.  Work and work environment.  Method of birth control.  Menstrual cycle.  Pregnancy history. What immunizations do I need?  Influenza (flu) vaccine  This is recommended every year. Tetanus, diphtheria, and pertussis (Tdap) vaccine  You may need a Td booster every 10 years. Varicella (chickenpox) vaccine  You may need this if you have not been vaccinated. Human papillomavirus (HPV) vaccine  If recommended by your health care provider, you may need three doses over 6 months. Measles, mumps, and rubella (MMR) vaccine  You may need at least one dose of MMR. You may also need a second dose. Meningococcal conjugate (MenACWY) vaccine  One dose is recommended if you are age 19-21 years and a first-year college student living in a residence hall, or if you have one of several medical conditions. You may also need additional booster doses. Pneumococcal conjugate (PCV13) vaccine  You may need  this if you have certain conditions and were not previously vaccinated. Pneumococcal polysaccharide (PPSV23) vaccine  You may need one or two doses if you smoke cigarettes or if you have certain conditions. Hepatitis A vaccine  You may need this if you have certain conditions or if you travel or work in places where you may be exposed to hepatitis A. Hepatitis B vaccine  You may need this if you have certain conditions or if you travel or work in places where you may be exposed to hepatitis B. Haemophilus influenzae type b (Hib) vaccine  You may need this if you have certain conditions. You may receive vaccines as individual doses or as more than one vaccine together in one shot (combination vaccines). Talk with your health care provider about the risks and benefits of combination vaccines. What tests do I need?  Blood tests  Lipid and cholesterol levels. These may be checked every 5 years starting at age 20.  Hepatitis C test.  Hepatitis B test. Screening  Diabetes screening. This is done by checking your blood sugar (glucose) after you have not eaten for a while (fasting).  Sexually transmitted disease (STD) testing.  BRCA-related cancer screening. This may be done if you have a family history of breast, ovarian, tubal, or peritoneal cancers.  Pelvic exam and Pap test. This may be done every 3 years starting at age 21. Starting at age 30, this may be done every 5 years if you have a Pap test in combination with an HPV test. Talk with your health care provider about your test results, treatment options, and if necessary, the need for more tests.   Follow these instructions at home: Eating and drinking   Eat a diet that includes fresh fruits and vegetables, whole grains, lean protein, and low-fat dairy.  Take vitamin and mineral supplements as recommended by your health care provider.  Do not drink alcohol if: ? Your health care provider tells you not to drink. ? You are  pregnant, may be pregnant, or are planning to become pregnant.  If you drink alcohol: ? Limit how much you have to 0-1 drink a day. ? Be aware of how much alcohol is in your drink. In the [place] one drink equals one 12 oz bottle of beer (355 mL), one 5 oz glass of wine (148 mL), or one 1 oz glass of hard liquor (44 mL). Lifestyle  Take daily care of your teeth and gums.  Stay active. Exercise for at least 30 minutes on 5 or more days each week.  Do not use any products that contain nicotine or tobacco, such as cigarettes, e-cigarettes, and chewing tobacco. If you need help quitting, ask your health care provider.  If you are sexually active, practice safe sex. Use a condom or other form of birth control (contraception) in order to prevent pregnancy and STIs (sexually transmitted infections). If you plan to become pregnant, see your health care provider for a preconception visit. What's next?  Visit your health care provider once a year for a well check visit.  Ask your health care provider how often you should have your eyes and teeth checked.  Stay up to date on all vaccines. This information is not intended to replace advice given to you by your health care provider. Make sure you discuss any questions you have with your health care provider. Document Revised: 09/01/2017 Document Reviewed: 09/01/2017 Elsevier Patient Education  2020 [employer]  Calorie Counting for Weight Loss Calories are units of energy. Your body needs a certain amount of calories from food to keep you going throughout the day. When you eat more calories than your body needs, your body stores the extra calories as fat. When you eat fewer calories than your body needs, your body burns fat to get the energy it needs. Calorie counting means keeping track of how many calories you eat and drink each day. Calorie counting can be helpful if you need to lose weight. If you make sure to eat fewer calories than your body  needs, you should lose weight. Ask your health care provider what a healthy weight is for you. For calorie counting to work, you will need to eat the right number of calories in a day in order to lose a healthy amount of weight per week. A dietitian can help you determine how many calories you need in a day and will give you suggestions on how to reach your calorie goal.  A healthy amount of weight to lose per week is usually 1-2 lb (0.5-0.9 kg). This usually means that your daily calorie intake should be reduced by 500-750 calories.  Eating 1,200 - 1,500 calories per day can help most women lose weight.  Eating 1,500 - 1,800 calories per day can help most men lose weight. What is my plan? My goal is to have __________ calories per day. If I have this many calories per day, I should lose around __________ pounds per week. What do I need to know about calorie counting? In order to meet your daily calorie goal, you will need to:  Find out how many calories are in each food   you would like to eat. Try to do this before you eat.  Decide how much of the food you plan to eat.  Write down what you ate and how many calories it had. Doing this is called keeping a food log. To successfully lose weight, it is important to balance calorie counting with a healthy lifestyle that includes regular activity. Aim for 150 minutes of moderate exercise (such as walking) or 75 minutes of vigorous exercise (such as running) each week. Where do I find calorie information?  The number of calories in a food can be found on a Nutrition Facts label. If a food does not have a Nutrition Facts label, try to look up the calories online or ask your dietitian for help. Remember that calories are listed per serving. If you choose to have more than one serving of a food, you will have to multiply the calories per serving by the amount of servings you plan to eat. For example, the label on a package of bread might say that a  serving size is 1 slice and that there are 90 calories in a serving. If you eat 1 slice, you will have eaten 90 calories. If you eat 2 slices, you will have eaten 180 calories. How do I keep a food log? Immediately after each meal, record the following information in your food log:  What you ate. Don't forget to include toppings, sauces, and other extras on the food.  How much you ate. This can be measured in cups, ounces, or number of items.  How many calories each food and drink had.  The total number of calories in the meal. Keep your food log near you, such as in a small notebook in your pocket, or use a mobile app or website. Some programs will calculate calories for you and show you how many calories you have left for the day to meet your goal. What are some calorie counting tips?   Use your calories on foods and drinks that will fill you up and not leave you hungry: ? Some examples of foods that fill you up are nuts and nut butters, vegetables, lean proteins, and high-fiber foods like whole grains. High-fiber foods are foods with more than 5 g fiber per serving. ? Drinks such as sodas, specialty coffee drinks, alcohol, and juices have a lot of calories, yet do not fill you up.  Eat nutritious foods and avoid empty calories. Empty calories are calories you get from foods or beverages that do not have many vitamins or protein, such as candy, sweets, and soda. It is better to have a nutritious high-calorie food (such as an avocado) than a food with few nutrients (such as a bag of chips).  Know how many calories are in the foods you eat most often. This will help you calculate calorie counts faster.  Pay attention to calories in drinks. Low-calorie drinks include water and unsweetened drinks.  Pay attention to nutrition labels for "low fat" or "fat free" foods. These foods sometimes have the same amount of calories or more calories than the full fat versions. They also often have added  sugar, starch, or salt, to make up for flavor that was removed with the fat.  Find a way of tracking calories that works for you. Get creative. Try different apps or programs if writing down calories does not work for you. What are some portion control tips?  Know how many calories are in a serving. This will help you   know how many servings of a certain food you can have.  Use a measuring cup to measure serving sizes. You could also try weighing out portions on a kitchen scale. With time, you will be able to estimate serving sizes for some foods.  Take some time to put servings of different foods on your favorite plates, bowls, and cups so you know what a serving looks like.  Try not to eat straight from a bag or box. Doing this can lead to overeating. Put the amount you would like to eat in a cup or on a plate to make sure you are eating the right portion.  Use smaller plates, glasses, and bowls to prevent overeating.  Try not to multitask (for example, watch TV or use your computer) while eating. If it is time to eat, sit down at a table and enjoy your food. This will help you to know when you are full. It will also help you to be aware of what you are eating and how much you are eating. What are tips for following this plan? Reading food labels  Check the calorie count compared to the serving size. The serving size may be smaller than what you are used to eating.  Check the source of the calories. Make sure the food you are eating is high in vitamins and protein and low in saturated and trans fats. Shopping  Read nutrition labels while you shop. This will help you make healthy decisions before you decide to purchase your food.  Make a grocery list and stick to it. Cooking  Try to cook your favorite foods in a healthier way. For example, try baking instead of frying.  Use low-fat dairy products. Meal planning  Use more fruits and vegetables. Half of your plate should be fruits and  vegetables.  Include lean proteins like poultry and fish. How do I count calories when eating out?  Ask for smaller portion sizes.  Consider sharing an entree and sides instead of getting your own entree.  If you get your own entree, eat only half. Ask for a box at the beginning of your meal and put the rest of your entree in it so you are not tempted to eat it.  If calories are listed on the menu, choose the lower calorie options.  Choose dishes that include vegetables, fruits, whole grains, low-fat dairy products, and lean protein.  Choose items that are boiled, broiled, grilled, or steamed. Stay away from items that are buttered, battered, fried, or served with cream sauce. Items labeled "crispy" are usually fried, unless stated otherwise.  Choose water, low-fat milk, unsweetened iced tea, or other drinks without added sugar. If you want an alcoholic beverage, choose a lower calorie option such as a glass of wine or light beer.  Ask for dressings, sauces, and syrups on the side. These are usually high in calories, so you should limit the amount you eat.  If you want a salad, choose a garden salad and ask for grilled meats. Avoid extra toppings like bacon, cheese, or fried items. Ask for the dressing on the side, or ask for olive oil and vinegar or lemon to use as dressing.  Estimate how many servings of a food you are given. For example, a serving of cooked rice is  cup or about the size of half a baseball. Knowing serving sizes will help you be aware of how much food you are eating at restaurants. The list below tells you how big   big or small some common portion sizes are based on everyday objects: ? 1 oz--4 stacked dice. ? 3 oz--1 deck of cards. ? 1 tsp--1 die. ? 1 Tbsp-- a ping-pong ball. ? 2 Tbsp--1 ping-pong ball. ?  cup-- baseball. ? 1 cup--1 baseball. Summary  Calorie counting means keeping track of how many calories you eat and drink each day. If you eat fewer calories  than your body needs, you should lose weight.  A healthy amount of weight to lose per week is usually 1-2 lb (0.5-0.9 kg). This usually means reducing your daily calorie intake by 500-750 calories.  The number of calories in a food can be found on a Nutrition Facts label. If a food does not have a Nutrition Facts label, try to look up the calories online or ask your dietitian for help.  Use your calories on foods and drinks that will fill you up, and not on foods and drinks that will leave you hungry.  Use smaller plates, glasses, and bowls to prevent overeating. This information is not intended to replace advice given to you by your health care provider. Make sure you discuss any questions you have with your health care provider. Document Revised: 09/09/2017 Document Reviewed: 11/21/2015 Elsevier Patient Education  [place].

## 2019-07-27 ENCOUNTER — Telehealth: Payer: No Typology Code available for payment source | Admitting: Emergency Medicine

## 2019-07-27 DIAGNOSIS — H1031 Unspecified acute conjunctivitis, right eye: Secondary | ICD-10-CM | POA: Diagnosis not present

## 2019-07-27 MED ORDER — OFLOXACIN 0.3 % OP SOLN: 1.0000 [drp] | Freq: Four times a day (QID) | OPHTHALMIC | 0 refills | Status: AC

## 2019-07-27 MED FILL — OFLOXACIN 0.3% EYE DROPS: 0.3 | 25 days supply | Qty: 5 | Fill #0

## 2019-07-27 NOTE — Progress Notes (Signed)
E-Visit for [employer]   We are sorry that you are not feeling well.  Here is how we plan to help!  Based on what you have shared with me it looks like you have conjunctivitis.  Conjunctivitis is a common inflammatory or infectious condition of the eye that is often referred to as "pink eye".  In most cases it is contagious (viral or bacterial). However, not all conjunctivitis requires antibiotics (ex. Allergic).  We have made appropriate suggestions for you based upon your presentation.  I have prescribed Oflaxacin 1-2 drops 4 times a day times 5 days   Pink eye can be highly contagious.  It is typically spread through direct contact with secretions, or contaminated objects or surfaces that one may have touched.  Strict handwashing is suggested with soap and water is urged.  If not available, use alcohol based had sanitizer.  Avoid unnecessary touching of the eye.  If you wear contact lenses, you will need to refrain from wearing them until you see no white discharge from the eye for at least 24 hours after being on medication.  You should see symptom improvement in 1-2 days after starting the medication regimen.  Call us if symptoms are not improved in 1-2 days.  Home Care:  Wash your hands often!  Do not wear your contacts until you complete your treatment plan.  Avoid sharing towels, bed linen, personal items with a person who has pink eye.  See attention for anyone in your home with similar symptoms.  Get Help Right Away If:  Your symptoms do not improve.  You develop blurred or loss of vision.  Your symptoms worsen (increased discharge, pain or redness)  Your e-visit answers were reviewed by a board certified advanced clinical practitioner to complete your personal care plan.  Depending on the condition, your plan could have included both over the counter or prescription medications.  If there is a problem please reply  once you have received a response from your provider.  Your  safety is important to [place].  If you have drug allergies check your prescription carefully.    You can use MyChart to ask questions about today's visit, request a non-urgent call back, or ask for a work or school excuse for 24 hours related to this e-Visit. If it has been greater than 24 hours you will need to follow up with your provider, or enter a new e-Visit to address those concerns.   You will get an e-mail in the next two days asking about your experience.  I hope that your e-visit has been valuable and will speed your recovery. Thank you for using e-visits.   I spent 5-10 minutes reviewing patient's medical chart.

## 2019-07-30 LAB — COMPLETE METABOLIC PANEL WITH GFR
AG Ratio: 1.7 (calc) (ref 1.0–2.5)
ALT: 24 U/L (ref 6–29)
AST: 11 U/L (ref 10–30)
Albumin: 4.4 g/dL (ref 3.6–5.1)
Alkaline phosphatase (APISO): 50 U/L (ref 31–125)
BUN: 12 mg/dL (ref 7–25)
CO2: 24 mmol/L (ref 20–32)
Calcium: 9.4 mg/dL (ref 8.6–10.2)
Chloride: 106 mmol/L (ref 98–110)
Creat: 0.71 mg/dL (ref 0.50–1.10)
GFR, Est African American: 132 mL/min/{1.73_m2} (ref >=60)
GFR, Est Non African American: 114 mL/min/{1.73_m2} (ref >=60)
Globulin: 2.6 g/dL (calc) (ref 1.9–3.7)
Glucose, Bld: 93 mg/dL (ref 65–99)
Potassium: 4 mmol/L (ref 3.5–5.3)
Sodium: 139 mmol/L (ref 135–146)
Total Bilirubin: 0.4 mg/dL (ref 0.2–1.2)
Total Protein: 7 g/dL (ref 6.1–8.1)

## 2019-07-30 LAB — LIPID PANEL
Cholesterol: 145 mg/dL (ref <200)
HDL: 42 mg/dL — ABNORMAL LOW (ref >=50)
LDL Cholesterol (Calc): 75 mg/dL (calc)
Non-HDL Cholesterol (Calc): 103 mg/dL (calc) (ref <130)
Total CHOL/HDL Ratio: 3.5 (calc) (ref <5.0)
Triglycerides: 185 mg/dL — ABNORMAL HIGH (ref <150)

## 2019-07-30 LAB — CBC WITH DIFFERENTIAL/PLATELET
Absolute Monocytes: 392 cells/uL (ref 200–950)
Basophils Absolute: 28 cells/uL (ref 0–200)
Basophils Relative: 0.4 %
Eosinophils Absolute: 168 cells/uL (ref 15–500)
Eosinophils Relative: 2.4 %
HCT: 35.3 % (ref 35.0–45.0)
Hemoglobin: 12 g/dL (ref 11.7–15.5)
Lymphs Abs: 1645 cells/uL (ref 850–3900)
MCH: 29.4 pg (ref 27.0–33.0)
MCHC: 34 g/dL (ref 32.0–36.0)
MCV: 86.5 fL (ref 80.0–100.0)
MPV: 11.2 fL (ref 7.5–12.5)
Monocytes Relative: 5.6 %
Neutro Abs: 4767 cells/uL (ref 1500–7800)
Neutrophils Relative %: 68.1 %
Platelets: 244 10*3/uL (ref 140–400)
RBC: 4.08 10*6/uL (ref 3.80–5.10)
RDW: 13.5 % (ref 11.0–15.0)
Total Lymphocyte: 23.5 %
WBC: 7 10*3/uL (ref 3.8–10.8)

## 2019-07-30 LAB — HIV ANTIBODY (ROUTINE TESTING W REFLEX): HIV 1&2 Ab, 4th Generation: NONREACTIVE

## 2019-07-30 LAB — HEPATITIS C ANTIBODY
Hepatitis C Ab: NONREACTIVE
SIGNAL TO CUT-OFF: 0.01 (ref <1.00)

## 2019-07-30 NOTE — Progress Notes (Signed)
Not all labs have resulted- I will send an additional note when those have come in.   HIV negative.  CBC and CMP look great. No indication of infection, issues with blood counts or electrolytes, and kidney and liver are good.   Bad cholesterol is great at 75. We would like to see your good cholesterol (HDL) a little higher. Your triglycerides are also a little elevated. Please work on a diet low in saturated fats.

## 2019-08-13 ENCOUNTER — Emergency Department (INDEPENDENT_AMBULATORY_CARE_PROVIDER_SITE_OTHER): Payer: No Typology Code available for payment source

## 2019-08-13 ENCOUNTER — Emergency Department
Admission: RE | Admit: 2019-08-13 | Discharge: 2019-08-13 | Disposition: A | Discharge status: Home / Self Care | Payer: No Typology Code available for payment source | Source: Ambulatory Visit

## 2019-08-13 ENCOUNTER — Other Ambulatory Visit: Payer: Self-pay

## 2019-08-13 VITALS — BP 108/68 | HR 95 | Temp 98.7°F | Resp 16

## 2019-08-13 DIAGNOSIS — U071 COVID-19: Secondary | ICD-10-CM | POA: Diagnosis not present

## 2019-08-13 DIAGNOSIS — R112 Nausea with vomiting, unspecified: Secondary | ICD-10-CM

## 2019-08-13 DIAGNOSIS — R509 Fever, unspecified: Secondary | ICD-10-CM | POA: Diagnosis not present

## 2019-08-13 DIAGNOSIS — R197 Diarrhea, unspecified: Secondary | ICD-10-CM

## 2019-08-13 MED ORDER — ONDANSETRON 4 MG PO TBDP: 4.0000 mg | ORAL_TABLET | Freq: Three times a day (TID) | ORAL | 0 refills | Status: DC | PRN

## 2019-08-13 MED FILL — ONDANSETRON ODT 4MG TBDP: 4 | 4 days supply | Qty: 12 | Fill #0

## 2019-08-13 NOTE — Discharge Instructions (Signed)
  You may take 500mg  acetaminophen every 4-6 hours or in combination with ibuprofen 400-600mg  every 6-8 hours as needed for pain, inflammation, and fever.  Be sure to well hydrated with clear liquids and get at least 8 hours of sleep at night, preferably more while sick.   Please follow up with family medicine in 2-3 days if not improving.  Call 911 or go to the hospital if symptoms worsening.

## 2019-08-13 NOTE — ED Provider Notes (Signed)
[facility]    CSN: [id number] Arrival date & time: 08/13/19  1001      History   Chief Complaint Chief Complaint  Patient presents with  . Appointment    10;00  . Fever    COVID positive    HPI Rhonda Rojas is a 29 y.o. female.   HPI  Rhonda Rojas is a 29 y.o. female presenting to UC with c/o fever Tmax 102*F over the weekend, temp of 99-100*F yesterday while taking tylenol. Pt tested positive for Covid-19 last week. She has a mild cough, congestion and has had 1 episode of vomiting and diarrhea over the weekend.  Today is her 10 day of isolation but was advised by health at work to be evaluated. Pt denies chset pain or SOB. No known sick contacts. She received her Covid vaccine in April/May.   Past Medical History:  Diagnosis Date  . Fatigue   . Vitamin D deficiency     Patient Active Problem List   Diagnosis Date Noted  . Low serum ferritin level 06/23/2018  . Encounter for surveillance of contraceptive pills 06/23/2018  . Elevated blood pressure reading 06/23/2018  . Overweight (BMI 25.0-29.9) 06/23/2018  . Cystic acne vulgaris 11/11/2017  . Fatigue     Past Surgical History:  Procedure Laterality Date  . TONSILLECTOMY      OB History    Gravida  2   Para  2   Term      Preterm      AB      Living  2     SAB      TAB      Ectopic      Multiple      Live Births               Home Medications    Prior to Admission medications   Medication Sig Start Date End Date Taking? Authorizing Provider  desogestrel-ethinyl estradiol (MIRCETTE) 0.15-0.02/0.01 MG (21/5) tablet Take 1 tablet by mouth daily. 07/06/19   Tollie Eth, NP  ferrous sulfate 325 (65 FE) MG tablet 325 mg. 1-2 times daily with a meal on M, W, F    [provider]  ondansetron (ZOFRAN-ODT) 4 MG disintegrating tablet Take 1 tablet (4 mg total) by mouth every 8 (eight) hours as needed for nausea or vomiting. 08/13/19   Lurene Shadow, PA-C    vitamin B-12 (CYANOCOBALAMIN) 500 MCG tablet Take 500 mcg by mouth daily.    [provider]    Family History Family History  Problem Relation Age of Onset  . Healthy Mother   . Healthy Father     Social History Social History   Tobacco Use  . Smoking status: Never Smoker  . Smokeless tobacco: Never Used  Vaping Use  . Vaping Use: Never used  Substance Use Topics  . Alcohol use: Yes    Comment: Occasionally  . Drug use: Never     Allergies   Patient has no known allergies.   Review of Systems Review of Systems  Constitutional: Positive for fever. Negative for chills.  HENT: Positive for congestion. Negative for ear pain, sore throat, trouble swallowing and voice change.   Respiratory: Positive for cough. Negative for shortness of breath.   Cardiovascular: Negative for chest pain and palpitations.  Gastrointestinal: Positive for diarrhea, nausea and vomiting. Negative for abdominal pain.  Musculoskeletal: Negative for arthralgias, back pain and myalgias.  Skin: Negative for rash.  All other systems reviewed and are negative.    Physical Exam Triage Vital Signs ED Triage Vitals  Enc Vitals Group     BP 08/13/19 1015 108/68     Pulse Rate 08/13/19 1015 95     Resp 08/13/19 1015 16     Temp 08/13/19 1015 98.7 F (37.1 C)     Temp Source 08/13/19 1015 Oral     SpO2 08/13/19 1015 98 %     Weight --      Height --      Head Circumference --      Peak Flow --      Pain Score 08/13/19 1013 0     Pain Loc --      Pain Edu? --      Excl. in GC? --    No data found.  Updated Vital Signs BP 108/68 (BP Location: Left Arm)   Pulse 95   Temp 98.7 F (37.1 C) (Oral)   Resp 16   SpO2 98%   Visual Acuity Right Eye Distance:   Left Eye Distance:   Bilateral Distance:    Right Eye Near:   Left Eye Near:    Bilateral Near:     Physical Exam Vitals and nursing note reviewed.  Constitutional:      General: She is not in acute distress.     Appearance: Normal appearance. She is well-developed. She is not ill-appearing, toxic-appearing or diaphoretic.  HENT:     Head: Normocephalic and atraumatic.     Right Ear: Tympanic membrane and ear canal normal.     Left Ear: Tympanic membrane and ear canal normal.     Nose: Nose normal.     Mouth/Throat:     Mouth: Mucous membranes are moist.     Pharynx: Oropharynx is clear.  Cardiovascular:     Rate and Rhythm: Normal rate and regular rhythm.  Pulmonary:     Effort: Pulmonary effort is normal. No respiratory distress.     Breath sounds: Normal breath sounds. No stridor. No wheezing, rhonchi or rales.  Abdominal:     General: There is no distension.     Palpations: Abdomen is soft.     Tenderness: There is no abdominal tenderness. There is no right CVA tenderness or left CVA tenderness.  Musculoskeletal:        General: Normal range of motion.     Cervical back: Normal range of motion.  Skin:    General: Skin is warm and dry.  Neurological:     Mental Status: She is alert and oriented to person, place, and time.  Psychiatric:        Behavior: Behavior normal.      UC Treatments / Results  Labs (all labs ordered are listed, but only abnormal results are displayed) Labs Reviewed - No data to display  EKG   Radiology DG Chest 2 View  Result Date: 08/13/2019 CLINICAL DATA:  COVID positive, fever EXAM: CHEST - 2 VIEW COMPARISON:  None. FINDINGS: The heart size and mediastinal contours are within normal limits. Both lungs are clear. The visualized skeletal structures are unremarkable. IMPRESSION: No acute abnormality of the lungs. Electronically Signed   By: Lauralyn Primes M.D.   On: 08/13/2019 11:28    Procedures Procedures (including critical care time)  Medications Ordered in UC Medications - No data to display  Initial Impression / Assessment and Plan / UC Course  I have reviewed the triage vital signs and the nursing notes.  Pertinent labs & imaging results that  were available during my care of the patient were reviewed by me and considered in my medical decision making (see chart for details).     Pt appears well, NAD Benign abdominal exam Reassured normal CXR Possible viral gastroenteritis on top of recently dx Covid-19 Rx: zofran Fluids and rest, f/u with PCP in 2-3 days if not improving Work note provided for tomorrow AVS given  Final Clinical Impressions(s) / UC Diagnoses   Final diagnoses:  COVID-19 virus infection  Nausea vomiting and diarrhea  Fever in adult     Discharge Instructions      You may take 500mg  acetaminophen every 4-6 hours or in combination with ibuprofen 400-600mg  every 6-8 hours as needed for pain, inflammation, and fever.  Be sure to well hydrated with clear liquids and get at least 8 hours of sleep at night, preferably more while sick.   Please follow up with family medicine in 2-3 days if not improving.  Call 911 or go to the hospital if symptoms worsening.      ED Prescriptions    Medication Sig Dispense Auth. Provider   ondansetron (ZOFRAN-ODT) 4 MG disintegrating tablet Take 1 tablet (4 mg total) by mouth every 8 (eight) hours as needed for nausea or vomiting. 12 tablet , Lurene Shadow     PDMP not reviewed this encounter.   [place], Lurene Shadow 08/13/19 1209

## 2019-08-13 NOTE — ED Triage Notes (Signed)
Patient presents to Urgent Care with complaints of fever since the past two days. Patient reports she has been taking tylenol for the fever and mucinex at night. Pt is covid positive, on her 10th day of isolation, was told by [place] at work to come be evaluated for the fever.

## 2019-09-03 ENCOUNTER — Other Ambulatory Visit: Payer: Self-pay

## 2019-09-03 ENCOUNTER — Telehealth: Payer: Self-pay | Admitting: *Deleted

## 2019-09-03 ENCOUNTER — Ambulatory Visit (INDEPENDENT_AMBULATORY_CARE_PROVIDER_SITE_OTHER): Payer: No Typology Code available for payment source | Admitting: Dermatology

## 2019-09-03 ENCOUNTER — Encounter: Payer: Self-pay | Admitting: Dermatology

## 2019-09-03 VITALS — Wt 172.0 lb

## 2019-09-03 DIAGNOSIS — L700 Acne vulgaris: Secondary | ICD-10-CM

## 2019-09-03 LAB — POCT URINE PREGNANCY: Preg Test, Ur: NEGATIVE

## 2019-09-03 MED ORDER — WINLEVI 1 % EX CREA: 1.0000 "application " | TOPICAL_CREAM | Freq: Every morning | CUTANEOUS | 3 refills | Status: DC

## 2019-09-03 NOTE — Telephone Encounter (Signed)
Prior authorization done via cover my meds for rx winlevi 1%Rhonda Rojas (Key: [id number])  Your information has been sent to [employer]. cream

## 2019-09-03 NOTE — Telephone Encounter (Signed)
Rx Winlevi 1% Cream   Rhonda Rojas (Key: [id number])  This request has been approved.  Please note any additional information provided by [employer] at the bottom of your screen.

## 2019-09-03 NOTE — Progress Notes (Signed)
   Follow-Up Visit   Subjective  Rhonda Rojas is a 29 y.o. female who presents for the following: Acne (Pt stated--acne--foreheah/chin area--redness, sore, itching, 3 months. Tried OTC not helping.).  Acne Location: Predominantly face Duration: Severe flare for 2 months Quality:  Associated Signs/Symptoms: Modifying Factors: Previous isotretinoin did clear her Severity:  Timing: Context:   Objective  Well appearing patient in no apparent distress; mood and affect are within normal limits.  A focused examination was performed including Head, neck, upper torso. Relevant physical exam findings are noted in the Assessment and Plan.   Assessment & Plan    Acne vulgaris (7) Head - Anterior (Face) (3); Left Buccal Cheek ; Right Buccal Cheek ; Left Forehead; Right Forehead  Clascoterone = Winlevi (if insurance covered) nightly for the next 1 to 2 months.  Reenter and I pledge for possible second round of isotretinoin.  Other Related Procedures POCT urine pregnancy      I, [name], MD, have reviewed all documentation for this visit.  The documentation on 09/03/19 for the exam, diagnosis, procedures, and orders are all accurate and complete.

## 2019-10-04 ENCOUNTER — Encounter: Payer: Self-pay | Admitting: Dermatology

## 2019-10-04 ENCOUNTER — Other Ambulatory Visit: Payer: Self-pay

## 2019-10-04 ENCOUNTER — Ambulatory Visit (INDEPENDENT_AMBULATORY_CARE_PROVIDER_SITE_OTHER): Payer: No Typology Code available for payment source | Admitting: Dermatology

## 2019-10-04 DIAGNOSIS — Z5181 Encounter for therapeutic drug level monitoring: Secondary | ICD-10-CM | POA: Diagnosis not present

## 2019-10-04 DIAGNOSIS — L700 Acne vulgaris: Secondary | ICD-10-CM

## 2019-10-04 MED ORDER — ISOTRETINOIN 40 MG PO CAPS
40.0000 mg | ORAL_CAPSULE | Freq: Every day | ORAL | 0 refills | Status: AC
Start: 2019-10-04 — End: 2019-11-03

## 2019-10-04 NOTE — Progress Notes (Signed)
Results for CHERRIL, HETT (MRN [id number]) as of 10/04/2019 08:25  Ref. Range 07/27/2019 12:10  Sodium Latest Ref Range: 135 - 146 mmol/L 139  Potassium Latest Ref Range: 3.5 - 5.3 mmol/L 4.0  Chloride Latest Ref Range: 98 - 110 mmol/L 106  CO2 Latest Ref Range: 20 - 32 mmol/L 24  Glucose Latest Ref Range: 65 - 99 mg/dL 93  BUN Latest Ref Range: 7 - 25 mg/dL 12  Creatinine Latest Ref Range: 0.50 - 1.10 mg/dL 7.51  Calcium Latest Ref Range: 8.6 - 10.2 mg/dL 9.4  BUN/Creatinine Ratio Latest Ref Range: 6 - 22 (calc) NOT APPLICABLE  AG Ratio Latest Ref Range: 1.0 - 2.5 (calc) 1.7  AST Latest Ref Range: 10 - 30 U/L 11  ALT Latest Ref Range: 6 - 29 U/L 24  Total Protein Latest Ref Range: 6.1 - 8.1 g/dL 7.0  Total Bilirubin Latest Ref Range: 0.2 - 1.2 mg/dL 0.4  GFR, Est Non African American Latest Ref Range: > OR = 60 mL/min/1.28m2 114  GFR, Est African American Latest Ref Range: > OR = 60 mL/min/1.71m2 132  Total CHOL/HDL Ratio Latest Ref Range: <5.0 (calc) 3.5  Cholesterol Latest Ref Range: <200 mg/dL 700  HDL Cholesterol Latest Ref Range: > OR = 50 mg/dL 42 (L)  LDL Cholesterol (Calc) Latest Units: mg/dL (calc) 75  Non-HDL Cholesterol (Calc) Latest Ref Range: <130 mg/dL (calc) 174  Triglycerides Latest Ref Range: <150 mg/dL 944 (H)  Alkaline phosphatase (APISO) Latest Ref Range: 31 - 125 U/L 50  Globulin Latest Ref Range: 1.9 - 3.7 g/dL (calc) 2.6  WBC Latest Ref Range: 3.8 - 10.8 Thousand/uL 7.0  RBC Latest Ref Range: 3.80 - 5.10 Million/uL 4.08  Hemoglobin Latest Ref Range: 11.7 - 15.5 g/dL 96.7  HCT Latest Ref Range: 35 - 45 % 35.3  MCV Latest Ref Range: 80.0 - 100.0 fL 86.5  MCH Latest Ref Range: 27.0 - 33.0 pg 29.4  MCHC Latest Ref Range: 32.0 - 36.0 g/dL 59.1  RDW Latest Ref Range: 11.0 - 15.0 % 13.5  Platelets Latest Ref Range: 140 - 400 Thousand/uL 244  MPV Latest Ref Range: 7.5 - 12.5 fL 11.2  Neutrophils Latest Units: % 68.1  Monocytes Relative Latest Units: % 5.6   Eosinophil Latest Units: % 2.4  Basophil Latest Units: % 0.4  NEUT# Latest Ref Range: 1,500 - 7,800 cells/uL 4,767  Lymphocyte # Latest Ref Range: 850 - 3,900 cells/uL 1,645  Total Lymphocyte Latest Units: % 23.5  Eosinophils Absolute Latest Ref Range: 15 - 500 cells/uL 168  Basophils Absolute Latest Ref Range: 0 - 200 cells/uL 28  Absolute Monocytes Latest Ref Range: 200 - 950 cells/uL 392  Albumin MSPROF Latest Ref Range: 3.6 - 5.1 g/dL 4.4  Hepatitis C Ab Latest Ref Range: NON-REACTI  NON-REACTIVE  SIGNAL TO CUT-OFF Latest Ref Range: <1.00  0.01  HIV Latest Ref Range: NON-REACTI  NON-REACTIVE

## 2019-10-05 ENCOUNTER — Telehealth: Payer: Self-pay | Admitting: Dermatology

## 2019-10-05 LAB — PREGNANCY, URINE: Preg Test, Ur: NEGATIVE

## 2019-10-05 NOTE — Telephone Encounter (Signed)
Labs to patient.

## 2019-10-05 NOTE — Telephone Encounter (Signed)
Patient left message on office voice mail saying that she was calling for bloodwork results from yesterday's visit with [name], MD for Accutane.

## 2019-10-15 ENCOUNTER — Other Ambulatory Visit: Payer: Self-pay | Admitting: Physician Assistant

## 2019-10-15 ENCOUNTER — Other Ambulatory Visit: Payer: Self-pay | Admitting: Nurse Practitioner

## 2019-10-15 DIAGNOSIS — Z3041 Encounter for surveillance of contraceptive pills: Secondary | ICD-10-CM

## 2019-10-15 MED FILL — DESOGESTR-ETH ESTRAD ETH ES: 0.15-0.02/0 | 84 days supply | Qty: 84 | Fill #0

## 2019-11-07 ENCOUNTER — Ambulatory Visit (INDEPENDENT_AMBULATORY_CARE_PROVIDER_SITE_OTHER): Payer: No Typology Code available for payment source | Admitting: Dermatology

## 2019-11-07 ENCOUNTER — Encounter: Payer: Self-pay | Admitting: Dermatology

## 2019-11-07 ENCOUNTER — Other Ambulatory Visit: Payer: Self-pay

## 2019-11-07 DIAGNOSIS — Z5181 Encounter for therapeutic drug level monitoring: Secondary | ICD-10-CM

## 2019-11-07 DIAGNOSIS — L700 Acne vulgaris: Secondary | ICD-10-CM

## 2019-11-07 MED ORDER — ISOTRETINOIN 40 MG PO CAPS: 40.0000 mg | ORAL_CAPSULE | Freq: Every day | ORAL | 0 refills | Status: AC

## 2019-11-08 ENCOUNTER — Encounter: Payer: Self-pay | Admitting: Dermatology

## 2019-11-08 LAB — COMPREHENSIVE METABOLIC PANEL
AG Ratio: 1.4 (calc) (ref 1.0–2.5)
ALT: 12 U/L (ref 6–29)
AST: 11 U/L (ref 10–30)
Albumin: 4 g/dL (ref 3.6–5.1)
Alkaline phosphatase (APISO): 39 U/L (ref 31–125)
BUN: 11 mg/dL (ref 7–25)
CO2: 20 mmol/L (ref 20–32)
Calcium: 9 mg/dL (ref 8.6–10.2)
Chloride: 106 mmol/L (ref 98–110)
Creat: 0.67 mg/dL (ref 0.50–1.10)
Globulin: 2.8 g/dL (calc) (ref 1.9–3.7)
Glucose, Bld: 93 mg/dL (ref 65–99)
Potassium: 4 mmol/L (ref 3.5–5.3)
Sodium: 137 mmol/L (ref 135–146)
Total Bilirubin: 0.4 mg/dL (ref 0.2–1.2)
Total Protein: 6.8 g/dL (ref 6.1–8.1)

## 2019-11-08 LAB — CBC WITH DIFFERENTIAL/PLATELET
Absolute Monocytes: 454 cells/uL (ref 200–950)
Basophils Absolute: 41 cells/uL (ref 0–200)
Basophils Relative: 0.5 %
Eosinophils Absolute: 454 cells/uL (ref 15–500)
Eosinophils Relative: 5.6 %
HCT: 35.2 % (ref 35.0–45.0)
Hemoglobin: 12.1 g/dL (ref 11.7–15.5)
Lymphs Abs: 2049 cells/uL (ref 850–3900)
MCH: 29.4 pg (ref 27.0–33.0)
MCHC: 34.4 g/dL (ref 32.0–36.0)
MCV: 85.4 fL (ref 80.0–100.0)
MPV: 10.8 fL (ref 7.5–12.5)
Monocytes Relative: 5.6 %
Neutro Abs: 5103 cells/uL (ref 1500–7800)
Neutrophils Relative %: 63 %
Platelets: 255 10*3/uL (ref 140–400)
RBC: 4.12 10*6/uL (ref 3.80–5.10)
RDW: 13.7 % (ref 11.0–15.0)
Total Lymphocyte: 25.3 %
WBC: 8.1 10*3/uL (ref 3.8–10.8)

## 2019-11-08 LAB — LIPID PANEL
Cholesterol: 170 mg/dL (ref <200)
HDL: 38 mg/dL — ABNORMAL LOW (ref >=50)
LDL Cholesterol (Calc): 90 mg/dL (calc)
Non-HDL Cholesterol (Calc): 132 mg/dL (calc) — ABNORMAL HIGH (ref <130)
Total CHOL/HDL Ratio: 4.5 (calc) (ref <5.0)
Triglycerides: 317 mg/dL — ABNORMAL HIGH (ref <150)

## 2019-11-08 LAB — HCG, SERUM, QUALITATIVE: Preg, Serum: NEGATIVE

## 2019-11-08 NOTE — Progress Notes (Signed)
   Follow-Up Visit   Subjective  Rhonda Rojas is a 29 y.o. female who presents for the following: Acne (31 day ).  Acne Location: Face more than torso Duration:  Quality: Improved Associated Signs/Symptoms: Modifying Factors: Isotretinoin Severity:  Timing: Context:   Objective  Well appearing patient in no apparent distress; mood and affect are within normal limits.  A focused examination was performed including face, neck, chest and back. Relevant physical exam findings are noted in the Assessment and Plan.   Assessment & Plan    Acne vulgaris Head - Anterior (Face)  Continue isotretinoin.  Continue I pledge compliance.  Contact me anytime with any problems.  CBC with Differential/Platelet - Head - Anterior (Face)  Comprehensive metabolic panel - Head - Anterior (Face)  Lipid panel - Head - Anterior (Face)  hCG, serum, qualitative - Head - Anterior (Face)  ISOtretinoin (ACCUTANE) 40 MG capsule - Head - Anterior (Face)  Encounter for therapeutic drug monitoring  Other Related Procedures CBC with Differential/Platelet Comprehensive metabolic panel Lipid panel hCG, serum, qualitative      I, [name], MD, have reviewed all documentation for this visit.  The documentation on 11/08/19 for the exam, diagnosis, procedures, and orders are all accurate and complete.

## 2019-11-08 NOTE — Progress Notes (Signed)
   Isotretinoin Follow-Up Visit   Subjective  Rhonda Rojas is a 29 y.o. female who presents for the following: Follow-up (31 days first RX  July labs normal fasting in 31 days).  Follow up 31 day Location:  Duration:  Quality:  Associated Signs/Symptoms: Modifying Factors:  Severity:  Timing: Context:   The following portions of the chart were reviewed this encounter and updated as appropriate: Tobacco  Allergies  Meds  Problems  Med Hx  Surg Hx  Fam Hx        Objective  Well appearing patient in no apparent distress; mood and affect are within normal limits.  A focused examination was performed including face, neck, chest and back. Relevant physical exam findings are noted in the Assessment and Plan.  Objective  Head - Anterior (Face): Cystic facial acne with risk of permanent scar.  Assessment & Plan  Acne vulgaris Head - Anterior (Face)  Initiate isotretinoin.  Long discussion about potential risks and need to follow I pledge.  Baseline labs.  Follow up in 31 days. Fasting labs.   Pregnancy, urine - Head - Anterior (Face)  ISOtretinoin (ACCUTANE) 40 MG capsule - Head - Anterior (Face)  Encounter for therapeutic drug monitoring  Other Related Procedures Pregnancy, urine

## 2019-11-10 ENCOUNTER — Encounter: Payer: Self-pay | Admitting: Dermatology

## 2019-11-21 ENCOUNTER — Telehealth: Payer: Self-pay

## 2019-11-21 NOTE — Telephone Encounter (Signed)
Theadora Rama Key: [id number] - PA Case ID: [id number] help? Call us at [phone] Status Sent to [facility] Winlevi 1% cream Form [employer] ePA Form 2017 NCPDP

## 2019-12-10 ENCOUNTER — Ambulatory Visit (INDEPENDENT_AMBULATORY_CARE_PROVIDER_SITE_OTHER): Payer: No Typology Code available for payment source | Admitting: Dermatology

## 2019-12-10 ENCOUNTER — Other Ambulatory Visit: Payer: Self-pay

## 2019-12-10 DIAGNOSIS — L700 Acne vulgaris: Secondary | ICD-10-CM

## 2019-12-10 DIAGNOSIS — Z79899 Other long term (current) drug therapy: Secondary | ICD-10-CM

## 2019-12-10 MED ORDER — ISOTRETINOIN 40 MG PO CAPS: 40.0000 mg | ORAL_CAPSULE | Freq: Every day | ORAL | 0 refills | Status: AC

## 2019-12-11 LAB — COMPREHENSIVE METABOLIC PANEL
AG Ratio: 1.4 (calc) (ref 1.0–2.5)
ALT: 12 U/L (ref 6–29)
AST: 12 U/L (ref 10–30)
Albumin: 4.2 g/dL (ref 3.6–5.1)
Alkaline phosphatase (APISO): 44 U/L (ref 31–125)
BUN: 16 mg/dL (ref 7–25)
CO2: 23 mmol/L (ref 20–32)
Calcium: 9.2 mg/dL (ref 8.6–10.2)
Chloride: 102 mmol/L (ref 98–110)
Creat: 0.66 mg/dL (ref 0.50–1.10)
Globulin: 3 g/dL (calc) (ref 1.9–3.7)
Glucose, Bld: 85 mg/dL (ref 65–99)
Potassium: 4.1 mmol/L (ref 3.5–5.3)
Sodium: 136 mmol/L (ref 135–146)
Total Bilirubin: 0.5 mg/dL (ref 0.2–1.2)
Total Protein: 7.2 g/dL (ref 6.1–8.1)

## 2019-12-11 LAB — LIPID PANEL
Cholesterol: 187 mg/dL (ref <200)
HDL: 39 mg/dL — ABNORMAL LOW (ref >=50)
Non-HDL Cholesterol (Calc): 148 mg/dL (calc) — ABNORMAL HIGH (ref <130)
Total CHOL/HDL Ratio: 4.8 (calc) (ref <5.0)
Triglycerides: 429 mg/dL — ABNORMAL HIGH (ref <150)

## 2019-12-11 LAB — CBC WITH DIFFERENTIAL/PLATELET
Absolute Monocytes: 292 cells/uL (ref 200–950)
Basophils Absolute: 41 cells/uL (ref 0–200)
Basophils Relative: 0.6 %
Eosinophils Absolute: 313 cells/uL (ref 15–500)
Eosinophils Relative: 4.6 %
HCT: 36.9 % (ref 35.0–45.0)
Hemoglobin: 12.4 g/dL (ref 11.7–15.5)
Lymphs Abs: 1761 cells/uL (ref 850–3900)
MCH: 29.5 pg (ref 27.0–33.0)
MCHC: 33.6 g/dL (ref 32.0–36.0)
MCV: 87.6 fL (ref 80.0–100.0)
MPV: 10.6 fL (ref 7.5–12.5)
Monocytes Relative: 4.3 %
Neutro Abs: 4393 cells/uL (ref 1500–7800)
Neutrophils Relative %: 64.6 %
Platelets: 228 10*3/uL (ref 140–400)
RBC: 4.21 10*6/uL (ref 3.80–5.10)
RDW: 13.7 % (ref 11.0–15.0)
Total Lymphocyte: 25.9 %
WBC: 6.8 10*3/uL (ref 3.8–10.8)

## 2019-12-11 LAB — HCG, SERUM, QUALITATIVE: Preg, Serum: NEGATIVE

## 2019-12-12 ENCOUNTER — Telehealth: Payer: Self-pay | Admitting: *Deleted

## 2019-12-12 ENCOUNTER — Encounter: Payer: Self-pay | Admitting: Dermatology

## 2019-12-12 NOTE — Telephone Encounter (Signed)
Gave patient labs results. Told her we will repeat fasting lipids next month and see if we need to adjust isotretinoin dose. She will limit sweets and alcohol as well per Dr. Sherryl Barters note.

## 2019-12-12 NOTE — Telephone Encounter (Signed)
-----   Message from [name], MD sent at 12/11/2019  8:11 PM EST ----- Please inform Rhonda Rojas that her triglycerides are elevated.  She should avoid simple sugars like sweets and candy and she should try to abstain from all alcohol.  Repeat fasting lipids on follow-up.  That will determine whether we have to reduce the dose of her isotretinoin.

## 2019-12-12 NOTE — Progress Notes (Signed)
   Follow-Up Visit   Subjective  Rhonda Rojas is a 29 y.o. female who presents for the following: Acne (31 day follow up).  Acne Location: Face more than torso Duration:  Quality: Improved Associated Signs/Symptoms: Modifying Factors: Isotretinoin Severity:  Timing: Context:   Objective  Well appearing patient in no apparent distress; mood and affect are within normal limits.  A focused examination was performed including Head and neck.. Relevant physical exam findings are noted in the Assessment and Plan.   Assessment & Plan    Acne vulgaris Head - Anterior (Face)  Continue Isotretinoin therapy.  Continue compliance with I pledge.  Call with any questions or problems.  CBC with Differential/Platelet - Head - Anterior (Face)  Comprehensive metabolic panel - Head - Anterior (Face)  Lipid panel - Head - Anterior (Face)  hCG, serum, qualitative - Head - Anterior (Face)  ISOtretinoin (ACCUTANE) 40 MG capsule - Head - Anterior (Face)  Drug therapy  Other Related Procedures CBC with Differential/Platelet Comprehensive metabolic panel Lipid panel hCG, serum, qualitative     I, [name], MD, have reviewed all documentation for this visit.  The documentation on 12/12/19 for the exam, diagnosis, procedures, and orders are all accurate and complete.

## 2019-12-21 MED FILL — DESOGESTR-ETH ESTRAD ETH ES: 0.15-0.02/0 | 84 days supply | Qty: 84 | Fill #1

## 2020-01-17 ENCOUNTER — Ambulatory Visit: Payer: No Typology Code available for payment source | Admitting: Dermatology

## 2020-01-23 ENCOUNTER — Other Ambulatory Visit: Payer: Self-pay

## 2020-01-23 ENCOUNTER — Ambulatory Visit (INDEPENDENT_AMBULATORY_CARE_PROVIDER_SITE_OTHER): Payer: No Typology Code available for payment source | Admitting: Physician Assistant

## 2020-01-23 ENCOUNTER — Other Ambulatory Visit: Payer: Self-pay | Admitting: Physician Assistant

## 2020-01-23 DIAGNOSIS — L700 Acne vulgaris: Secondary | ICD-10-CM

## 2020-01-23 DIAGNOSIS — Z5181 Encounter for therapeutic drug level monitoring: Secondary | ICD-10-CM | POA: Diagnosis not present

## 2020-01-23 MED ORDER — ISOTRETINOIN 40 MG PO CAPS: 40.0000 mg | ORAL_CAPSULE | Freq: Every day | ORAL | 0 refills | Status: DC

## 2020-01-24 LAB — PREGNANCY, URINE: Preg Test, Ur: NEGATIVE

## 2020-01-28 ENCOUNTER — Encounter: Payer: Self-pay | Admitting: Physician Assistant

## 2020-01-28 NOTE — Progress Notes (Signed)
   Isotretinoin Follow-Up Visit   Subjective  Rhonda Rojas is a 30 y.o. female who presents for the following: Acne (31 day follow up).  The following portions of the chart were reviewed this encounter and updated as appropriate:        Review of Systems: No other skin or systemic complaints.  Objective  Well appearing patient in no apparent distress; mood and affect are within normal limits.  A focused examination was performed including face. Relevant physical exam findings are noted in the Assessment and Plan. Objective  Head - Anterior (Face): Few Erythematous papules   Assessment & Plan  Acne vulgaris Head - Anterior (Face)  Pregnancy, urine - Head - Anterior (Face)  ISOtretinoin (ACCUTANE) 40 MG capsule - Head - Anterior (Face)  Encounter for therapeutic drug monitoring  Other Related Procedures Pregnancy, urine  Reordered Medications ISOtretinoin (ACCUTANE) 40 MG capsule  I, Terra Aveni, PA-C, have reviewed all documentation for this visit. The documentation on 01/28/20 for the exam, diagnosis, procedures, and orders are all accurate and complete.

## 2020-02-18 ENCOUNTER — Ambulatory Visit: Payer: No Typology Code available for payment source | Admitting: Dermatology

## 2020-02-25 ENCOUNTER — Other Ambulatory Visit: Payer: Self-pay

## 2020-02-25 ENCOUNTER — Ambulatory Visit (INDEPENDENT_AMBULATORY_CARE_PROVIDER_SITE_OTHER): Payer: No Typology Code available for payment source | Admitting: Dermatology

## 2020-02-25 ENCOUNTER — Encounter: Payer: Self-pay | Admitting: Dermatology

## 2020-02-25 DIAGNOSIS — L700 Acne vulgaris: Secondary | ICD-10-CM | POA: Diagnosis not present

## 2020-02-25 DIAGNOSIS — Z5181 Encounter for therapeutic drug level monitoring: Secondary | ICD-10-CM

## 2020-02-25 MED ORDER — ISOTRETINOIN 40 MG PO CAPS: 40.0000 mg | ORAL_CAPSULE | Freq: Every day | ORAL | 0 refills | Status: DC

## 2020-02-25 NOTE — Progress Notes (Signed)
   Follow-Up Visit   Subjective  Rhonda Rojas is a 30 y.o. female who presents for the following: Acne (31 day isotretinoin- f/u ).  Acne Location: Mostly face Duration:  Quality: Almost t clear except for persistent pink spots Associated Signs/Symptoms: Modifying Factors: Isotretinoin Severity:  Timing: Context:   Objective  Well appearing patient in no apparent distress; mood and affect are within normal limits. Objective  Head - Anterior (Face): Residual erythema without actual active deep inflammatory papules.  Essentially no postinflammatory hyperpigmentation now.  She states no new lesions.  Normal mood.  Compliant with I pledge.  Moderate cheilitis and slight dryness on hands.  No nosebleeds.    A focused examination was performed including Head and neck.. Relevant physical exam findings are noted in the Assessment and Plan.   Assessment & Plan    Acne vulgaris Head - Anterior (Face)  Continue isotretinoin- f/u in 31 days.  40 mg daily.  Will reach [place] target dose in 5 months.  Pregnancy, urine - Head - Anterior (Face)  Reordered Medications ISOtretinoin (ACCUTANE) 40 MG capsule  Encounter for therapeutic drug monitoring  Other Related Procedures Pregnancy, urine  Reordered Medications ISOtretinoin (ACCUTANE) 40 MG capsule      I, [name], MD, have reviewed all documentation for this visit.  The documentation on 02/25/20 for the exam, diagnosis, procedures, and orders are all accurate and complete.

## 2020-02-26 LAB — PREGNANCY, URINE: Preg Test, Ur: NEGATIVE

## 2020-03-24 ENCOUNTER — Ambulatory Visit: Payer: No Typology Code available for payment source | Admitting: Dermatology

## 2020-03-27 ENCOUNTER — Encounter: Payer: Self-pay | Admitting: Dermatology

## 2020-03-27 ENCOUNTER — Ambulatory Visit (INDEPENDENT_AMBULATORY_CARE_PROVIDER_SITE_OTHER): Payer: No Typology Code available for payment source | Admitting: Dermatology

## 2020-03-27 ENCOUNTER — Other Ambulatory Visit: Payer: Self-pay

## 2020-03-27 DIAGNOSIS — Z5181 Encounter for therapeutic drug level monitoring: Secondary | ICD-10-CM | POA: Diagnosis not present

## 2020-03-27 DIAGNOSIS — L700 Acne vulgaris: Secondary | ICD-10-CM | POA: Diagnosis not present

## 2020-03-27 MED ORDER — ISOTRETINOIN 40 MG PO CAPS: 40.0000 mg | ORAL_CAPSULE | Freq: Every day | ORAL | 0 refills | Status: DC

## 2020-03-28 LAB — PREGNANCY, URINE: Preg Test, Ur: NEGATIVE

## 2020-03-31 MED FILL — DESOGESTR-ETH ESTRAD ETH ES: 0.15-0.02/0 | 84 days supply | Qty: 84 | Fill #2

## 2020-04-07 ENCOUNTER — Encounter: Payer: Self-pay | Admitting: Dermatology

## 2020-04-07 NOTE — Progress Notes (Signed)
   Follow-Up Visit   Subjective  Rhonda Rojas is a 30 y.o. female who presents for the following: Acne (31 day isotretinoin-).  Acne Location:  Duration:  Quality: Deep inflammatory lesions clear Associated Signs/Symptoms: Modifying Factors: [place] none Severity:  Timing: Context:   Objective  Well appearing patient in no apparent distress; mood and affect are within normal limits. Objective  Left Malar Cheek: Inflammatory acne mostly clear, no deep lesions.  Moderate cheilitis.  Normal mood.  Compliant with I pledge.    A focused examination was performed including Head and neck.. Relevant physical exam findings are noted in the Assessment and Plan.   Assessment & Plan    Acne vulgaris Left Malar Cheek  Call withContinue 40 mg isotretinoin daily; will reach [place] target dose in 3 months.  Maintain I pledge compliance.  Reminded of need to sun protect as we get further into the spring.  Questions or problems.  Pregnancy, urine - Left Malar Cheek  ISOtretinoin (ACCUTANE) 40 MG capsule - Left Malar Cheek  Other Related Medications ISOtretinoin (ACCUTANE) 40 MG capsule  Encounter for therapeutic drug monitoring  Other Related Procedures Pregnancy, urine  Reordered Medications ISOtretinoin (ACCUTANE) 40 MG capsule  Other Related Medications ISOtretinoin (ACCUTANE) 40 MG capsule      I, [name], MD, have reviewed all documentation for this visit.  The documentation on 04/07/20 for the exam, diagnosis, procedures, and orders are all accurate and complete.

## 2020-04-21 ENCOUNTER — Telehealth: Payer: No Typology Code available for payment source | Admitting: Nurse Practitioner

## 2020-04-21 ENCOUNTER — Other Ambulatory Visit (HOSPITAL_COMMUNITY): Payer: Self-pay

## 2020-04-21 DIAGNOSIS — H5789 Other specified disorders of eye and adnexa: Secondary | ICD-10-CM

## 2020-04-21 MED ORDER — POLYMYXIN B-TRIMETHOPRIM 10000-0.1 UNIT/ML-% OP SOLN
2.0000 [drp] | Freq: Three times a day (TID) | OPHTHALMIC | 0 refills | Status: DC
  Filled 2020-04-21: qty 10, 5d supply, fill #0

## 2020-04-21 MED ORDER — POLYMYXIN B-TRIMETHOPRIM 10000-0.1 UNIT/ML-% OP SOLN
2.0000 [drp] | OPHTHALMIC | 0 refills | Status: DC
Start: 2020-04-21 — End: 2020-04-21
  Filled 2020-04-21: qty 10, 17d supply, fill #0

## 2020-04-21 NOTE — Progress Notes (Signed)
E-Visit for Pink Eye   We are sorry that you are not feeling well.  Here is how we plan to help!  Based on what you have shared with me it looks like you have conjunctivitis.  Conjunctivitis is a common inflammatory or infectious condition of the eye that is often referred to as "pink eye".  In most cases it is contagious (viral or bacterial). However, not all conjunctivitis requires antibiotics (ex. Allergic).  We have made appropriate suggestions for you based upon your presentation.  I have prescribed Polytrim Ophthalmic drops 1-2 drops 4 times a day times 5 days  Pink eye can be highly contagious.  It is typically spread through direct contact with secretions, or contaminated objects or surfaces that one may have touched.  Strict handwashing is suggested with soap and water is urged.  If not available, use alcohol based had sanitizer.  Avoid unnecessary touching of the eye.  If you wear contact lenses, you will need to refrain from wearing them until you see no white discharge from the eye for at least 24 hours after being on medication.  You should see symptom improvement in 1-2 days after starting the medication regimen.  Call us if symptoms are not improved in 1-2 days.  Home Care:  Wash your hands often!  Do not wear your contacts until you complete your treatment plan.  Avoid sharing towels, bed linen, personal items with a person who has pink eye.  See attention for anyone in your home with similar symptoms.  Get Help Right Away If:  Your symptoms do not improve.  You develop blurred or loss of vision.  Your symptoms worsen (increased discharge, pain or redness)  Your e-visit answers were reviewed by a board certified advanced clinical practitioner to complete your personal care plan.  Depending on the condition, your plan could have included both over the counter or prescription medications.  If there is a problem please reply  once you have received a response from your  provider.  Your safety is important to us.  If you have drug allergies check your prescription carefully.    You can use MyChart to ask questions about today's visit, request a non-urgent call back, or ask for a work or school excuse for 24 hours related to this e-Visit. If it has been greater than 24 hours you will need to follow up with your provider, or enter a new e-Visit to address those concerns.   You will get an e-mail in the next two days asking about your experience.  I hope that your e-visit has been valuable and will speed your recovery. Thank you for using e-visits.   5-10 minutes spent reviewing and documenting in chart.    

## 2020-04-21 NOTE — Addendum Note (Signed)
Addended by: Bennie Pierini on: 04/21/2020 08:36 AM   Modules accepted: Orders

## 2020-04-28 ENCOUNTER — Encounter: Payer: Self-pay | Admitting: Dermatology

## 2020-04-28 ENCOUNTER — Ambulatory Visit: Payer: No Typology Code available for payment source | Admitting: Dermatology

## 2020-04-28 ENCOUNTER — Ambulatory Visit (INDEPENDENT_AMBULATORY_CARE_PROVIDER_SITE_OTHER): Payer: No Typology Code available for payment source | Admitting: Dermatology

## 2020-04-28 ENCOUNTER — Other Ambulatory Visit: Payer: Self-pay

## 2020-04-28 DIAGNOSIS — Z5181 Encounter for therapeutic drug level monitoring: Secondary | ICD-10-CM

## 2020-04-28 DIAGNOSIS — L700 Acne vulgaris: Secondary | ICD-10-CM

## 2020-04-29 LAB — PREGNANCY, URINE: Preg Test, Ur: NEGATIVE

## 2020-04-29 MED ORDER — ISOTRETINOIN 40 MG PO CAPS: 40.0000 mg | ORAL_CAPSULE | Freq: Every day | ORAL | 0 refills | Status: DC

## 2020-05-09 NOTE — Progress Notes (Signed)
   Follow-Up Visit   Subjective  Rhonda Rojas is a 30 y.o. female who presents for the following: Acne (Here for isotretinoin f/u).  Acne Location:  Duration:  Quality: Clear Associated Signs/Symptoms: Modifying Factors: Isotretinoin Severity:  Timing: Context:   Objective  Well appearing patient in no apparent distress; mood and affect are within normal limits. Objective  Right Malar Cheek: Deep inflammatory papules clear, minimal residual erythema.  Normal mood.  No sunburns.  Good understanding of I pledge.    A focused examination was performed including Head and neck. Relevant physical exam findings are noted in the Assessment and Plan.   Assessment & Plan    Acne vulgaris Right Malar Cheek  Continue isotretinoin.  Will reach [place] target dose in 2 months.  Sun protection.  She knows she can call me with any questions or issues.  Other Related Procedures Pregnancy, urine  Reordered Medications ISOtretinoin (ACCUTANE) 40 MG capsule  Encounter for therapeutic drug monitoring  Other Related Procedures Pregnancy, urine  Reordered Medications ISOtretinoin (ACCUTANE) 40 MG capsule      I, [name], MD, have reviewed all documentation for this visit.  The documentation on 05/09/20 for the exam, diagnosis, procedures, and orders are all accurate and complete.

## 2020-05-29 ENCOUNTER — Ambulatory Visit (INDEPENDENT_AMBULATORY_CARE_PROVIDER_SITE_OTHER): Payer: No Typology Code available for payment source | Admitting: Physician Assistant

## 2020-05-29 ENCOUNTER — Encounter: Payer: Self-pay | Admitting: Physician Assistant

## 2020-05-29 ENCOUNTER — Other Ambulatory Visit: Payer: Self-pay

## 2020-05-29 DIAGNOSIS — Z5181 Encounter for therapeutic drug level monitoring: Secondary | ICD-10-CM | POA: Diagnosis not present

## 2020-05-29 DIAGNOSIS — L700 Acne vulgaris: Secondary | ICD-10-CM | POA: Diagnosis not present

## 2020-05-29 LAB — POCT URINE PREGNANCY

## 2020-05-29 MED ORDER — ISOTRETINOIN 40 MG PO CAPS: 40.0000 mg | ORAL_CAPSULE | Freq: Every day | ORAL | 0 refills | Status: DC

## 2020-05-29 NOTE — Progress Notes (Signed)
   Isotretinoin Follow-Up Visit   Subjective  Rhonda Rojas is a 30 y.o. female who presents for the following: Acne (31 days f/u ).  The following portions of the chart were reviewed this encounter and updated as appropriate:       Isotretinoin F/U - 05/29/20 0800      Isotretinoin Follow Up   iPledge # [id number]   negative pregnancy test updated and must be filled by 6/1   Date 05/29/20    Two Forms of Birth Control Oral Contraceptives (w/ estrogen);Abstinence;Female Condom      Dosage   Target Dosage (mg) 9381    Current (To Date) Dosage (mg) 8400    To Go Dosage (mg) 981      Side Effects   Skin Chapped Lips    Gastrointestinal WNL           Review of Systems: No other skin or systemic complaints.  Objective  Well appearing patient in no apparent distress; mood and affect are within normal limits.  A focused examination was performed including face. Relevant physical exam findings are noted in the Assessment and Plan.  Objective  face: Completely clear with slight PIH.   Assessment & Plan  Acne vulgaris face Due to office closed and patient window will be missed provider ok in house urine for visit toady. Test negative and ipledge updated.  ISOtretinoin (ACCUTANE) 40 MG capsule - face  POCT urine pregnancy - face  Encounter for therapeutic drug monitoring  Other Related Procedures POCT urine pregnancy  Reordered Medications ISOtretinoin (ACCUTANE) 40 MG capsule  Patient is aware of risks.  I, Farris Blash, PA-C, have reviewed all documentation for this visit. The documentation on 05/29/20 for the exam, diagnosis, procedures, and orders are all accurate and complete.

## 2020-06-04 ENCOUNTER — Other Ambulatory Visit (HOSPITAL_COMMUNITY): Payer: Self-pay

## 2020-06-04 MED FILL — Desogest-Eth Estrad & Eth Estrad Tab 0.15-0.02/0.01 MG(21/5): ORAL | 84 days supply | Qty: 84 | Fill #0 | Status: CN

## 2020-06-19 ENCOUNTER — Other Ambulatory Visit (HOSPITAL_COMMUNITY): Payer: Self-pay

## 2020-06-19 MED FILL — Desogest-Eth Estrad & Eth Estrad Tab 0.15-0.02/0.01 MG(21/5): ORAL | 84 days supply | Qty: 84 | Fill #0 | Status: AC

## 2020-06-30 ENCOUNTER — Ambulatory Visit (INDEPENDENT_AMBULATORY_CARE_PROVIDER_SITE_OTHER): Payer: No Typology Code available for payment source | Admitting: Dermatology

## 2020-06-30 ENCOUNTER — Other Ambulatory Visit: Payer: Self-pay

## 2020-06-30 ENCOUNTER — Encounter: Payer: Self-pay | Admitting: Dermatology

## 2020-06-30 DIAGNOSIS — L700 Acne vulgaris: Secondary | ICD-10-CM

## 2020-06-30 DIAGNOSIS — Z5181 Encounter for therapeutic drug level monitoring: Secondary | ICD-10-CM | POA: Diagnosis not present

## 2020-06-30 MED ORDER — ARAZLO 0.045 % EX LOTN: 1.0000 "application " | TOPICAL_LOTION | Freq: Every day | CUTANEOUS | 6 refills | Status: DC

## 2020-07-01 LAB — PREGNANCY, URINE: Preg Test, Ur: NEGATIVE

## 2020-07-17 ENCOUNTER — Encounter: Payer: Self-pay | Admitting: Dermatology

## 2020-07-17 NOTE — Progress Notes (Signed)
   Follow-Up Visit   Subjective  Rhonda Rojas is a 30 y.o. female who presents for the following: Follow-up.  Acne Location:  Duration:  Quality: Clear Associated Signs/Symptoms: Modifying Factors: Tretinoin Severity:  Timing: Context:   Objective  Well appearing patient in no apparent distress; mood and affect are within normal limits. Head - Anterior (Face) Acne clear, will have her on 1 final month of isotretinoin making her target dose between the [place] on 20 mg/kg of the rest of the oral 150 mg/kg.    A focused examination was performed including head and neck.. Relevant physical exam findings are noted in the Assessment and Plan.   Assessment & Plan    Acne vulgaris Head - Anterior (Face)  Finished isotretinoin with this prescription.  Sun protection.  Begin topical Arazlo twice weekly on areas with historical tendency to acne 2 weeks after her last isotretinoin.  Return as needed flare.  Pregnancy, urine - Head - Anterior (Face)  Tazarotene (ARAZLO) 0.045 % LOTN - Head - Anterior (Face) Apply 1 application topically daily.  Related Medications ISOtretinoin (ACCUTANE) 40 MG capsule Take 1 capsule (40 mg total) by mouth daily.  Encounter for therapeutic drug monitoring  Related Procedures Pregnancy, urine  Related Medications ISOtretinoin (ACCUTANE) 40 MG capsule Take 1 capsule (40 mg total) by mouth daily.      I, [name], MD, have reviewed all documentation for this visit.  The documentation on 07/17/20 for the exam, diagnosis, procedures, and orders are all accurate and complete.

## 2020-07-25 ENCOUNTER — Encounter: Payer: Self-pay | Admitting: Medical-Surgical

## 2020-07-25 ENCOUNTER — Ambulatory Visit (INDEPENDENT_AMBULATORY_CARE_PROVIDER_SITE_OTHER): Payer: No Typology Code available for payment source | Admitting: Medical-Surgical

## 2020-07-25 VITALS — BP 105/71 | HR 78 | Resp 20 | Ht 64.5 in | Wt 167.0 lb

## 2020-07-25 DIAGNOSIS — Z0000 Encounter for general adult medical examination without abnormal findings: Secondary | ICD-10-CM | POA: Diagnosis not present

## 2020-07-25 DIAGNOSIS — Z1329 Encounter for screening for other suspected endocrine disorder: Secondary | ICD-10-CM | POA: Diagnosis not present

## 2020-07-25 DIAGNOSIS — Z7689 Persons encountering health services in other specified circumstances: Secondary | ICD-10-CM | POA: Diagnosis not present

## 2020-07-25 NOTE — Progress Notes (Signed)
HPI: Rhonda Rojas is a 30 y.o. female who  has a past medical history of Fatigue and Vitamin D deficiency.  she presents to [facility] today, 07/25/20,  for chief complaint of: Annual physical exam  Dentist: every 6 months Eye exam: 2022, glasses, contacts Exercise: exercising regularly Diet: limits sweets, no sodas Pap smear: about 2 years ago COVID vaccine: done and boosted  Concerns: None  Past medical, surgical, social and family history reviewed:  Patient Active Problem List   Diagnosis Date Noted   Low serum ferritin level 06/23/2018   Encounter for surveillance of contraceptive pills 06/23/2018   Elevated blood pressure reading 06/23/2018   Overweight (BMI 25.0-29.9) 06/23/2018   Cystic acne vulgaris 11/11/2017   Fatigue     Past Surgical History:  Procedure Laterality Date   TONSILLECTOMY      Social History   Tobacco Use   Smoking status: Never   Smokeless tobacco: Never  Substance Use Topics   Alcohol use: Yes    Comment: Occasionally    Family History  Problem Relation Age of Onset   Healthy Mother    Healthy Father      Current medication list and allergy/intolerance information reviewed:    Current Outpatient Medications  Medication Sig Dispense Refill   desogestrel-ethinyl estradiol (MIRCETTE) 0.15-0.02/0.01 MG (21/5) tablet TAKE 1 TABLET BY MOUTH DAILY. 84 tablet 4   Tazarotene (ARAZLO) 0.045 % LOTN Apply 1 application topically daily. 45 g 6   ISOtretinoin (ACCUTANE) 40 MG capsule Take 1 capsule (40 mg total) by mouth daily. (Patient not taking: Reported on 07/25/2020) 30 capsule 0   No current facility-administered medications for this visit.    No Known Allergies    Review of Systems: Constitutional:  No  fever, no chills, No recent illness, No unintentional weight changes. No significant fatigue.  HEENT: No  headache, no vision change, no hearing change, No sore throat, No  sinus  pressure Cardiac: No  chest pain, No  pressure, No palpitations, No  Orthopnea Respiratory:  No  shortness of breath. No  Cough Gastrointestinal: No  abdominal pain, No  nausea, No  vomiting,  No  blood in stool, No  diarrhea, No  constipation  Musculoskeletal: No new myalgia/arthralgia Skin: No  Rash, No other wounds/concerning lesions Genitourinary: No  incontinence, No  abnormal genital bleeding, No abnormal genital discharge Hem/Onc: No  easy bruising/bleeding, No  abnormal lymph node Endocrine: No cold intolerance,  No heat intolerance. No polyuria/polydipsia/polyphagia  Neurologic: No  weakness, No  dizziness, No  slurred speech/focal weakness/facial droop Psychiatric: No  concerns with depression, No  concerns with anxiety, No sleep problems, No mood problems  Exam:  BP 105/71 (BP Location: Left Arm, Patient Position: Sitting, Cuff Size: Normal)   Pulse 78   Resp 20   Ht 5' 4.5" (1.638 m)   Wt 167 lb (75.8 kg)   SpO2 100%   BMI 28.22 kg/m  Constitutional: VS see above. General Appearance: alert, well-developed, well-nourished, NAD Eyes: Normal lids and conjunctive, non-icteric sclera Ears, Nose, Mouth, Throat: MMM, Normal external inspection ears/nares/mouth/lips/gums. TM normal bilaterally.  Neck: No masses, trachea midline. No thyroid enlargement. No tenderness/mass appreciated. No lymphadenopathy Respiratory: Normal respiratory effort. no wheeze, no rhonchi, no rales Cardiovascular: S1/S2 normal, no murmur, no rub/gallop auscultated. RRR. No lower extremity edema. Pedal pulse II/IV bilaterally PT. No carotid bruit or JVD. No abdominal aortic bruit. Gastrointestinal: Nontender, no masses. No hepatomegaly, no splenomegaly. No hernia appreciated. Bowel  sounds normal. Rectal exam deferred.  Musculoskeletal: Gait normal. No clubbing/cyanosis of digits.  Neurological: Normal balance/coordination. No tremor. No cranial nerve deficit on limited exam. Motor and sensation intact and  symmetric. Cerebellar reflexes intact.  Skin: warm, dry, intact. No rash/ulcer. No concerning nevi or subq nodules on limited exam.   Psychiatric: Normal judgment/insight. Normal mood and affect. Oriented x3.    ASSESSMENT/PLAN:   1. Encounter to establish care Reviewed available information and discussed healthcare concerns with patient.  2. Annual physical exam Checking CBC with differential, CMP, and lipid panel today.  She is otherwise to date on her preventative care. - CBC with Differential/Platelet - COMPLETE METABOLIC PANEL WITH GFR - Lipid panel  3. Thyroid disorder screen Checking TSH today - TSH   Orders Placed This Encounter  Procedures   CBC with Differential/Platelet   COMPLETE METABOLIC PANEL WITH GFR   Lipid panel   TSH    No orders of the defined types were placed in this encounter.   Patient Instructions  Preventive Care 21-44 Years Old, Female Preventive care refers to lifestyle choices and visits with your health care provider that can promote health and wellness. This includes: A yearly physical exam. This is also called an annual wellness visit. Regular dental and eye exams. Immunizations. Screening for certain conditions. Healthy lifestyle choices, such as: Eating a healthy diet. Getting regular exercise. Not using drugs or products that contain nicotine and tobacco. Limiting alcohol use. What can I expect for my preventive care visit? Physical exam Your health care provider may check your: Height and weight. These may be used to calculate your BMI (body mass index). BMI is a measurement that tells if you are at a healthy weight. Heart rate and blood pressure. Body temperature. Skin for abnormal spots. Counseling Your health care provider may ask you questions about your: Past medical problems. Family's medical history. Alcohol, tobacco, and drug use. Emotional well-being. Home life and relationship well-being. Sexual activity. Diet,  exercise, and sleep habits. Work and work Statistician. Access to firearms. Method of birth control. Menstrual cycle. Pregnancy history. What immunizations do I need?  Vaccines are usually given at various ages, according to a schedule. Your health care provider will recommend vaccines for you based on your age, medicalhistory, and lifestyle or other factors, such as travel or where you work. What tests do I need?  Blood tests Lipid and cholesterol levels. These may be checked every 5 years starting at age 48. Hepatitis C test. Hepatitis B test. Screening Diabetes screening. This is done by checking your blood sugar (glucose) after you have not eaten for a while (fasting). STD (sexually transmitted disease) testing, if you are at risk. BRCA-related cancer screening. This may be done if you have a family history of breast, ovarian, tubal, or peritoneal cancers. Pelvic exam and Pap test. This may be done every 3 years starting at age 68. Starting at age 38, this may be done every 5 years if you have a Pap test in combination with an HPV test. Talk with your health care provider about your test results, treatment options,and if necessary, the need for more tests. Follow these instructions at home: Eating and drinking  Eat a healthy diet that includes fresh fruits and vegetables, whole grains, lean protein, and low-fat dairy products. Take vitamin and mineral supplements as recommended by your health care provider. Do not drink alcohol if: Your health care provider tells you not to drink. You are pregnant, may be pregnant, or are  planning to become pregnant. If you drink alcohol: Limit how much you have to 0-1 drink a day. Be aware of how much alcohol is in your drink. In the [place] one drink equals one 12 oz bottle of beer (355 mL), one 5 oz glass of wine (148 mL), or one 1 oz glass of hard liquor (44 mL).  Lifestyle Take daily care of your teeth and gums. Brush your teeth every morning  and night with fluoride toothpaste. Floss one time each day. Stay active. Exercise for at least 30 minutes 5 or more days each week. Do not use any products that contain nicotine or tobacco, such as cigarettes, e-cigarettes, and chewing tobacco. If you need help quitting, ask your health care provider. Do not use drugs. If you are sexually active, practice safe sex. Use a condom or other form of protection to prevent STIs (sexually transmitted infections). If you do not wish to become pregnant, use a form of birth control. If you plan to become pregnant, see your health care provider for a prepregnancy visit. Find healthy ways to cope with stress, such as: Meditation, yoga, or listening to music. Journaling. Talking to a trusted person. Spending time with friends and family. Safety Always wear your seat belt while driving or riding in a vehicle. Do not drive: If you have been drinking alcohol. Do not ride with someone who has been drinking. When you are tired or distracted. While texting. Wear a helmet and other protective equipment during sports activities. If you have firearms in your house, make sure you follow all gun safety procedures. Seek help if you have been physically or sexually abused. What's next? Go to your health care provider once a year for an annual wellness visit. Ask your health care provider how often you should have your eyes and teeth checked. Stay up to date on all vaccines. This information is not intended to replace advice given to you by your health care provider. Make sure you discuss any questions you have with your healthcare provider. Document Revised: 08/19/2019 Document Reviewed: 09/01/2017 Elsevier Patient Education  [employer] [place].  Follow-up plan: Return in about 1 year (around 07/25/2021) for annual physical exam or sooner if needed .  Clearnce Sorrel, DNP, APRN, FNP-BC [facility]

## 2020-07-25 NOTE — Patient Instructions (Signed)
Preventive Care 21-31 Years Old, Female Preventive care refers to lifestyle choices and visits with your health care provider that can promote health and wellness. This includes: A yearly physical exam. This is also called an annual wellness visit. Regular dental and eye exams. Immunizations. Screening for certain conditions. Healthy lifestyle choices, such as: Eating a healthy diet. Getting regular exercise. Not using drugs or products that contain nicotine and tobacco. Limiting alcohol use. What can I expect for my preventive care visit? Physical exam Your health care provider may check your: Height and weight. These may be used to calculate your BMI (body mass index). BMI is a measurement that tells if you are at a healthy weight. Heart rate and blood pressure. Body temperature. Skin for abnormal spots. Counseling Your health care provider may ask you questions about your: Past medical problems. Family's medical history. Alcohol, tobacco, and drug use. Emotional well-being. Home life and relationship well-being. Sexual activity. Diet, exercise, and sleep habits. Work and work environment. Access to firearms. Method of birth control. Menstrual cycle. Pregnancy history. What immunizations do I need?  Vaccines are usually given at various ages, according to a schedule. Your health care provider will recommend vaccines for you based on your age, medicalhistory, and lifestyle or other factors, such as travel or where you work. What tests do I need?  Blood tests Lipid and cholesterol levels. These may be checked every 5 years starting at age 20. Hepatitis C test. Hepatitis B test. Screening Diabetes screening. This is done by checking your blood sugar (glucose) after you have not eaten for a while (fasting). STD (sexually transmitted disease) testing, if you are at risk. BRCA-related cancer screening. This may be done if you have a family history of breast, ovarian, tubal, or  peritoneal cancers. Pelvic exam and Pap test. This may be done every 3 years starting at age 21. Starting at age 30, this may be done every 5 years if you have a Pap test in combination with an HPV test. Talk with your health care provider about your test results, treatment options,and if necessary, the need for more tests. Follow these instructions at home: Eating and drinking  Eat a healthy diet that includes fresh fruits and vegetables, whole grains, lean protein, and low-fat dairy products. Take vitamin and mineral supplements as recommended by your health care provider. Do not drink alcohol if: Your health care provider tells you not to drink. You are pregnant, may be pregnant, or are planning to become pregnant. If you drink alcohol: Limit how much you have to 0-1 drink a day. Be aware of how much alcohol is in your drink. In the [place] one drink equals one 12 oz bottle of beer (355 mL), one 5 oz glass of wine (148 mL), or one 1 oz glass of hard liquor (44 mL).  Lifestyle Take daily care of your teeth and gums. Brush your teeth every morning and night with fluoride toothpaste. Floss one time each day. Stay active. Exercise for at least 30 minutes 5 or more days each week. Do not use any products that contain nicotine or tobacco, such as cigarettes, e-cigarettes, and chewing tobacco. If you need help quitting, ask your health care provider. Do not use drugs. If you are sexually active, practice safe sex. Use a condom or other form of protection to prevent STIs (sexually transmitted infections). If you do not wish to become pregnant, use a form of birth control. If you plan to become pregnant, see your health care   provider for a prepregnancy visit. Find healthy ways to cope with stress, such as: Meditation, yoga, or listening to music. Journaling. Talking to a trusted person. Spending time with friends and family. Safety Always wear your seat belt while driving or riding in a  vehicle. Do not drive: If you have been drinking alcohol. Do not ride with someone who has been drinking. When you are tired or distracted. While texting. Wear a helmet and other protective equipment during sports activities. If you have firearms in your house, make sure you follow all gun safety procedures. Seek help if you have been physically or sexually abused. What's next? Go to your health care provider once a year for an annual wellness visit. Ask your health care provider how often you should have your eyes and teeth checked. Stay up to date on all vaccines. This information is not intended to replace advice given to you by your health care provider. Make sure you discuss any questions you have with your healthcare provider. Document Revised: 08/19/2019 Document Reviewed: 09/01/2017 Elsevier Patient Education  2022 [employer].

## 2020-07-26 LAB — CBC WITH DIFFERENTIAL/PLATELET
Absolute Monocytes: 451 cells/uL (ref 200–950)
Basophils Absolute: 38 cells/uL (ref 0–200)
Basophils Relative: 0.4 %
Eosinophils Absolute: 338 cells/uL (ref 15–500)
Eosinophils Relative: 3.6 %
HCT: 39.2 % (ref 35.0–45.0)
Hemoglobin: 13.5 g/dL (ref 11.7–15.5)
Lymphs Abs: 2660 cells/uL (ref 850–3900)
MCH: 30.5 pg (ref 27.0–33.0)
MCHC: 34.4 g/dL (ref 32.0–36.0)
MCV: 88.7 fL (ref 80.0–100.0)
MPV: 10.3 fL (ref 7.5–12.5)
Monocytes Relative: 4.8 %
Neutro Abs: 5913 cells/uL (ref 1500–7800)
Neutrophils Relative %: 62.9 %
Platelets: 280 10*3/uL (ref 140–400)
RBC: 4.42 10*6/uL (ref 3.80–5.10)
RDW: 12.9 % (ref 11.0–15.0)
Total Lymphocyte: 28.3 %
WBC: 9.4 10*3/uL (ref 3.8–10.8)

## 2020-07-26 LAB — COMPLETE METABOLIC PANEL WITH GFR
AG Ratio: 1.5 (calc) (ref 1.0–2.5)
ALT: 14 U/L (ref 6–29)
AST: 11 U/L (ref 10–30)
Albumin: 4.5 g/dL (ref 3.6–5.1)
Alkaline phosphatase (APISO): 55 U/L (ref 31–125)
BUN: 12 mg/dL (ref 7–25)
CO2: 28 mmol/L (ref 20–32)
Calcium: 9.9 mg/dL (ref 8.6–10.2)
Chloride: 101 mmol/L (ref 98–110)
Creat: 0.68 mg/dL (ref 0.50–0.97)
Globulin: 3 g/dL (calc) (ref 1.9–3.7)
Glucose, Bld: 96 mg/dL (ref 65–99)
Potassium: 4.2 mmol/L (ref 3.5–5.3)
Sodium: 137 mmol/L (ref 135–146)
Total Bilirubin: 0.4 mg/dL (ref 0.2–1.2)
Total Protein: 7.5 g/dL (ref 6.1–8.1)
eGFR: 119 mL/min/{1.73_m2} (ref >=60)

## 2020-07-26 LAB — TSH: TSH: 2.32 mIU/L

## 2020-07-26 LAB — LIPID PANEL
Cholesterol: 176 mg/dL (ref <200)
HDL: 55 mg/dL (ref >=50)
LDL Cholesterol (Calc): 89 mg/dL (calc)
Non-HDL Cholesterol (Calc): 121 mg/dL (calc) (ref <130)
Total CHOL/HDL Ratio: 3.2 (calc) (ref <5.0)
Triglycerides: 231 mg/dL — ABNORMAL HIGH (ref <150)

## 2020-07-31 ENCOUNTER — Other Ambulatory Visit: Payer: Self-pay

## 2020-07-31 ENCOUNTER — Ambulatory Visit (INDEPENDENT_AMBULATORY_CARE_PROVIDER_SITE_OTHER): Payer: No Typology Code available for payment source

## 2020-07-31 DIAGNOSIS — L700 Acne vulgaris: Secondary | ICD-10-CM

## 2020-07-31 DIAGNOSIS — Z5181 Encounter for therapeutic drug level monitoring: Secondary | ICD-10-CM

## 2020-07-31 NOTE — Progress Notes (Signed)
NTS last urine pregnancy test, therapy complete.

## 2020-08-01 LAB — PREGNANCY, URINE: Preg Test, Ur: NEGATIVE

## 2020-08-05 ENCOUNTER — Ambulatory Visit: Payer: No Typology Code available for payment source | Admitting: Dermatology

## 2020-09-09 ENCOUNTER — Other Ambulatory Visit (HOSPITAL_COMMUNITY): Payer: Self-pay

## 2020-09-09 MED FILL — Desogest-Eth Estrad & Eth Estrad Tab 0.15-0.02/0.01 MG(21/5): ORAL | 84 days supply | Qty: 84 | Fill #1 | Status: AC

## 2020-10-16 ENCOUNTER — Other Ambulatory Visit (INDEPENDENT_AMBULATORY_CARE_PROVIDER_SITE_OTHER): Payer: Self-pay | Admitting: Pediatrics

## 2020-11-06 ENCOUNTER — Telehealth: Payer: Self-pay

## 2020-11-06 NOTE — Telephone Encounter (Signed)
Approval Rhonda Rojas 11/06/20-10/05/2021

## 2020-11-06 NOTE — Telephone Encounter (Signed)
Theadora Rama Key: [id number] - PA Case ID: [id number] help? Call us at [phone] Status Sent to [employer] [facility] 1% cream Form [employer] ePA Form 2017 NCPDP

## 2020-11-24 ENCOUNTER — Other Ambulatory Visit: Payer: Self-pay | Admitting: Nurse Practitioner

## 2020-11-24 ENCOUNTER — Other Ambulatory Visit (HOSPITAL_BASED_OUTPATIENT_CLINIC_OR_DEPARTMENT_OTHER): Payer: Self-pay

## 2020-11-24 ENCOUNTER — Other Ambulatory Visit: Payer: Self-pay | Admitting: Medical-Surgical

## 2020-11-24 DIAGNOSIS — Z3041 Encounter for surveillance of contraceptive pills: Secondary | ICD-10-CM

## 2020-11-24 MED ORDER — DESOGESTREL-ETHINYL ESTRADIOL 0.15-0.02/0.01 MG (21/5) PO TABS
1.0000 | ORAL_TABLET | Freq: Every day | ORAL | 4 refills | Status: DC
  Filled 2020-11-24: qty 84, 84d supply, fill #0
  Filled 2021-01-27 – 2021-02-09 (×2): qty 84, 84d supply, fill #1
  Filled 2021-05-05: qty 84, 84d supply, fill #2
  Filled 2021-07-14 – 2021-07-20 (×3): qty 84, 84d supply, fill #3
  Filled 2021-10-01 – 2021-10-07 (×2): qty 84, 84d supply, fill #4

## 2020-11-25 ENCOUNTER — Other Ambulatory Visit (HOSPITAL_BASED_OUTPATIENT_CLINIC_OR_DEPARTMENT_OTHER): Payer: Self-pay

## 2021-01-27 ENCOUNTER — Other Ambulatory Visit (HOSPITAL_BASED_OUTPATIENT_CLINIC_OR_DEPARTMENT_OTHER): Payer: Self-pay

## 2021-02-09 ENCOUNTER — Other Ambulatory Visit (HOSPITAL_BASED_OUTPATIENT_CLINIC_OR_DEPARTMENT_OTHER): Payer: Self-pay

## 2021-02-14 IMAGING — DX DG CHEST 2V
2 series · 2 of 2 positions shown · non-contrast
Comparison: None.

CLINICAL DATA: COVID positive, fever

EXAM:
CHEST - 2 VIEW

[chest pa]
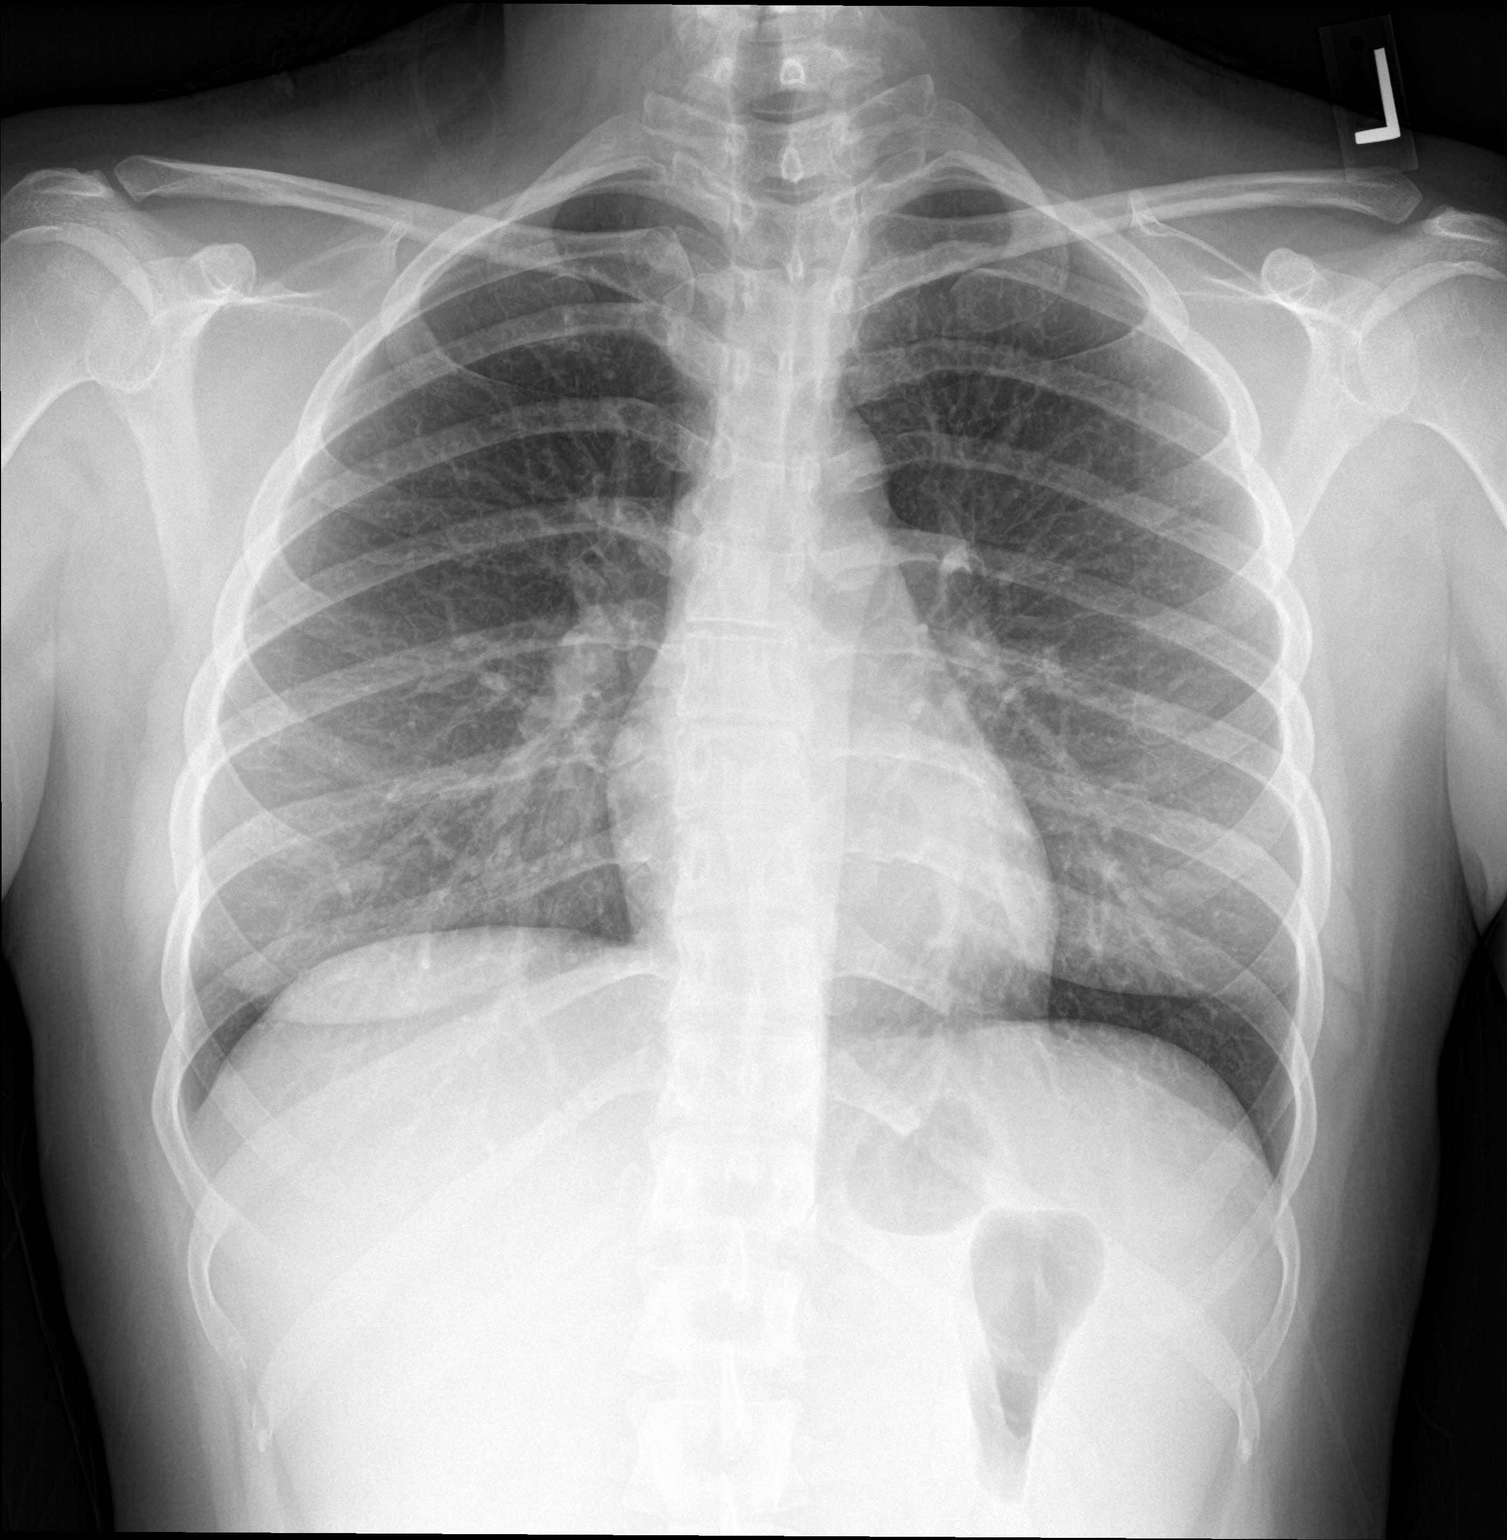

[chest lat]
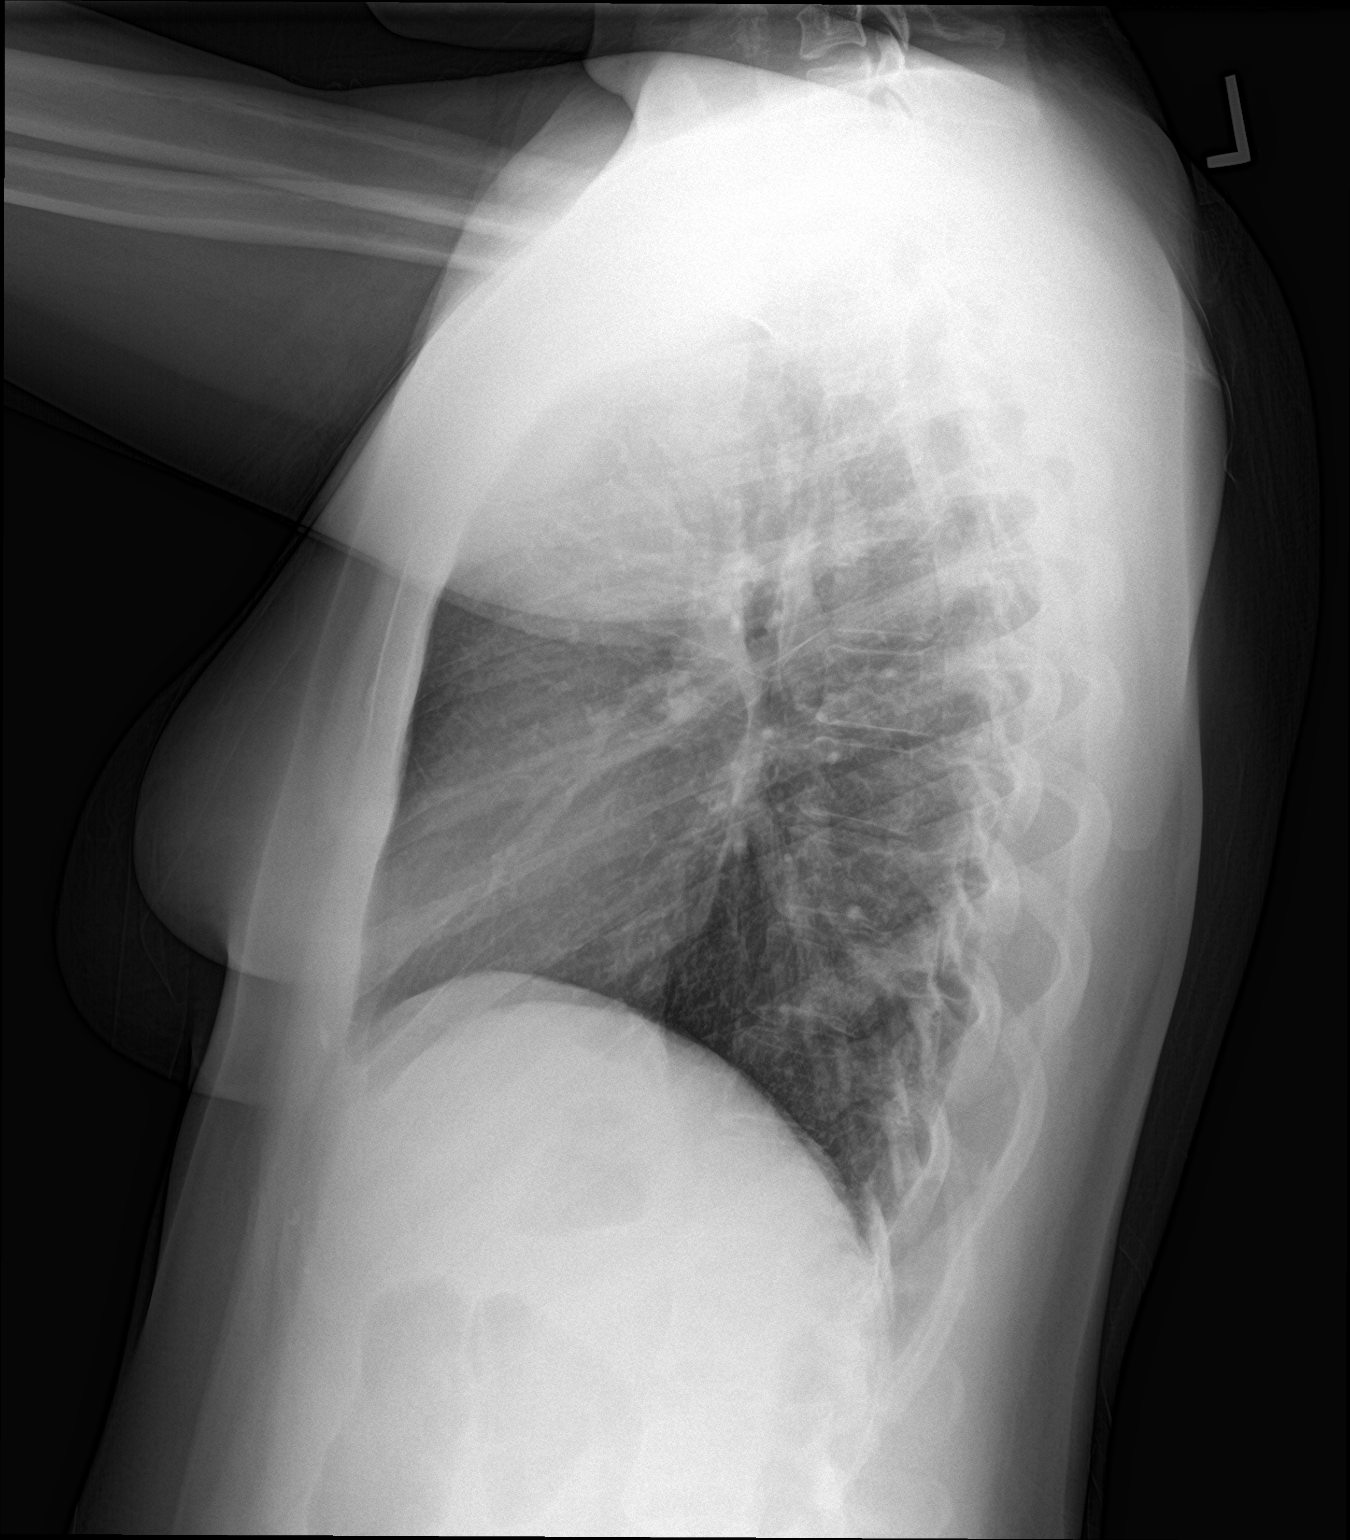

[2 of 2 positions shown; findings below may reference images not displayed]

FINDINGS: The heart size and mediastinal contours are within normal limits.
Both lungs are clear. The visualized skeletal structures are
unremarkable.
IMPRESSION: No acute abnormality of the lungs.

## 2021-05-05 ENCOUNTER — Other Ambulatory Visit (HOSPITAL_BASED_OUTPATIENT_CLINIC_OR_DEPARTMENT_OTHER): Payer: Self-pay

## 2021-07-13 ENCOUNTER — Ambulatory Visit: Payer: No Typology Code available for payment source | Admitting: Physician Assistant

## 2021-07-14 ENCOUNTER — Other Ambulatory Visit (HOSPITAL_BASED_OUTPATIENT_CLINIC_OR_DEPARTMENT_OTHER): Payer: Self-pay

## 2021-07-16 ENCOUNTER — Other Ambulatory Visit (HOSPITAL_BASED_OUTPATIENT_CLINIC_OR_DEPARTMENT_OTHER): Payer: Self-pay

## 2021-07-20 ENCOUNTER — Other Ambulatory Visit (HOSPITAL_BASED_OUTPATIENT_CLINIC_OR_DEPARTMENT_OTHER): Payer: Self-pay

## 2021-08-11 NOTE — Progress Notes (Unsigned)
   Established Patient Office Visit  Subjective   Patient ID: Rhonda Rojas, female   DOB: 12-19-1990 Age: 31 y.o. MRN: [id number]   No chief complaint on file.  HPI Pleasant 31 year old female presenting today for evaluation of a lump on her arm.  Reports this is painless however she is concerned and would like to have it looked at.   Objective:    There were no vitals filed for this visit.  Physical Exam   No results found for this or any previous visit (from the past 24 hour(s)).   {Labs (Optional):23779}  The ASCVD Risk score (Arnett DK, et al., 2019) failed to calculate for the following reasons:   The 2019 ASCVD risk score is only valid for ages 69 to 80   Assessment & Plan:   No problem-specific Assessment & Plan notes found for this encounter.   No follow-ups on file.  ___________________________________________ Thayer Ohm, DNP, APRN, FNP-BC [facility] [place]

## 2021-08-13 ENCOUNTER — Ambulatory Visit (INDEPENDENT_AMBULATORY_CARE_PROVIDER_SITE_OTHER): Payer: No Typology Code available for payment source | Admitting: Medical-Surgical

## 2021-08-13 ENCOUNTER — Encounter: Payer: Self-pay | Admitting: Medical-Surgical

## 2021-08-13 VITALS — BP 116/68 | HR 84 | Resp 20 | Ht 64.5 in | Wt 177.8 lb

## 2021-08-13 DIAGNOSIS — R2232 Localized swelling, mass and lump, left upper limb: Secondary | ICD-10-CM

## 2021-08-13 DIAGNOSIS — L989 Disorder of the skin and subcutaneous tissue, unspecified: Secondary | ICD-10-CM

## 2021-08-20 ENCOUNTER — Ambulatory Visit (INDEPENDENT_AMBULATORY_CARE_PROVIDER_SITE_OTHER): Payer: No Typology Code available for payment source

## 2021-08-20 DIAGNOSIS — R2232 Localized swelling, mass and lump, left upper limb: Secondary | ICD-10-CM

## 2021-09-24 ENCOUNTER — Encounter: Payer: Self-pay | Admitting: Medical-Surgical

## 2021-09-24 ENCOUNTER — Other Ambulatory Visit (HOSPITAL_COMMUNITY)
Admission: RE | Admit: 2021-09-24 | Discharge: 2021-09-24 | Disposition: A | Discharge status: Home / Self Care | Payer: No Typology Code available for payment source | Source: Ambulatory Visit | Attending: Medical-Surgical | Admitting: Medical-Surgical

## 2021-09-24 ENCOUNTER — Ambulatory Visit (INDEPENDENT_AMBULATORY_CARE_PROVIDER_SITE_OTHER): Payer: No Typology Code available for payment source | Admitting: Medical-Surgical

## 2021-09-24 VITALS — BP 90/57 | HR 77 | Resp 20 | Ht 64.5 in | Wt 176.9 lb

## 2021-09-24 DIAGNOSIS — Z124 Encounter for screening for malignant neoplasm of cervix: Secondary | ICD-10-CM | POA: Diagnosis present

## 2021-09-24 DIAGNOSIS — Z1329 Encounter for screening for other suspected endocrine disorder: Secondary | ICD-10-CM

## 2021-09-24 DIAGNOSIS — Z0000 Encounter for general adult medical examination without abnormal findings: Secondary | ICD-10-CM | POA: Diagnosis not present

## 2021-09-24 DIAGNOSIS — Z131 Encounter for screening for diabetes mellitus: Secondary | ICD-10-CM

## 2021-09-24 DIAGNOSIS — N898 Other specified noninflammatory disorders of vagina: Secondary | ICD-10-CM

## 2021-09-24 LAB — WET PREP FOR TRICH, YEAST, CLUE
MICRO NUMBER:: 13949613
Specimen Quality: ADEQUATE

## 2021-09-24 MED ORDER — METRONIDAZOLE 500 MG PO TABS: 500.0000 mg | ORAL_TABLET | Freq: Two times a day (BID) | ORAL | 0 refills | Status: AC

## 2021-09-24 NOTE — Progress Notes (Signed)
Complete physical exam  Patient: Rhonda Rojas   DOB: 03-Aug-1990   31 y.o. Female  MRN: [id number]  Subjective:    Chief Complaint  Patient presents with   Annual Exam   Gynecologic Exam    Cameron Tyrika Newman is a 31 y.o. female who presents today for a complete physical exam. She reports consuming a general diet. The patient does not participate in regular exercise at present. She generally feels well. She reports sleeping well. She does not have additional problems to discuss today.    Most recent fall risk assessment:    08/13/2021    9:38 AM  [place] in the past year? 0  Number falls in past yr: 0  Injury with Fall? 0  Risk for fall due to : No Fall Risks  Follow up Falls evaluation completed     Most recent depression screenings:    09/24/2021    8:56 AM 08/13/2021    9:39 AM  PHQ 2/9 Scores  PHQ - 2 Score 0 0    Vision:Within last year, Dental: No current dental problems and Receives regular dental care, and STD: The patient denies history of sexually transmitted disease.    Patient Care Team: Samuel Bouche, NP as PCP - General (Nurse Practitioner) Lavonna Monarch, MD (Inactive) as Consulting Physician (Dermatology) Warren Danes, PA-C as Physician Assistant (Dermatology)   Outpatient Medications Prior to Visit  Medication Sig   desogestrel-ethinyl estradiol (MIRCETTE) 0.15-0.02/0.01 MG (21/5) tablet TAKE 1 TABLET BY MOUTH DAILY.   No facility-administered medications prior to visit.    Review of Systems  Constitutional:  Negative for chills, fever, malaise/fatigue and weight loss.  HENT:  Negative for congestion, ear pain, hearing loss, sinus pain and sore throat.   Eyes:  Negative for blurred vision, photophobia and pain.  Respiratory:  Negative for cough, shortness of breath and wheezing.   Cardiovascular:  Negative for chest pain, palpitations and leg swelling.  Gastrointestinal:  Negative for abdominal pain, constipation, diarrhea,  heartburn, nausea and vomiting.  Genitourinary:  Negative for dysuria, frequency and urgency.       Thick white vaginal discharge.   Musculoskeletal:  Negative for falls and neck pain.  Skin:  Negative for itching and rash.  Neurological:  Negative for dizziness, weakness and headaches.  Endo/Heme/Allergies:  Negative for polydipsia. Does not bruise/bleed easily.  Psychiatric/Behavioral:  Negative for depression, substance abuse and suicidal ideas. The patient is not nervous/anxious.      Objective:    BP (!) 88/57 (BP Location: Right Arm, Cuff Size: Normal)   Pulse 77   Resp 20   Ht 5' 4.5" (1.638 m)   Wt 176 lb 14.4 oz (80.2 kg)   SpO2 100%   BMI 29.90 kg/m    Physical Exam Constitutional:      General: She is not in acute distress.    Appearance: Normal appearance. She is not ill-appearing.  HENT:     Head: Normocephalic and atraumatic.     Right Ear: Tympanic membrane, ear canal and external ear normal. There is no impacted cerumen.     Left Ear: Tympanic membrane, ear canal and external ear normal. There is no impacted cerumen.     Nose: Nose normal.     Mouth/Throat:     Mouth: Mucous membranes are moist.     Pharynx: No oropharyngeal exudate or posterior oropharyngeal erythema.  Eyes:     General: No scleral icterus.  Right eye: No discharge.        Left eye: No discharge.     Extraocular Movements: Extraocular movements intact.     Conjunctiva/sclera: Conjunctivae normal.     Pupils: Pupils are equal, round, and reactive to light.  Neck:     Thyroid: No thyromegaly.     Vascular: No carotid bruit or JVD.     Trachea: Trachea normal.  Cardiovascular:     Rate and Rhythm: Normal rate and regular rhythm.     Pulses: Normal pulses.     Heart sounds: Normal heart sounds. No murmur heard.    No friction rub. No gallop.  Pulmonary:     Effort: Pulmonary effort is normal. No respiratory distress.     Breath sounds: Normal breath sounds. No wheezing.   Abdominal:     General: Bowel sounds are normal. There is no distension.     Palpations: Abdomen is soft.     Tenderness: There is no abdominal tenderness. There is no guarding.  Musculoskeletal:        General: Normal range of motion.     Cervical back: Normal range of motion and neck supple.  Skin:    General: Skin is warm and dry.  Neurological:     Mental Status: She is alert and oriented to person, place, and time.     Cranial Nerves: No cranial nerve deficit.  Psychiatric:        Mood and Affect: Mood normal.        Behavior: Behavior normal.        Thought Content: Thought content normal.        Judgment: Judgment normal.   No results found for any visits on 09/24/21.     Assessment & Plan:    Routine Health Maintenance and Physical Exam  Immunization History  Administered Date(s) Administered   Influenza,inj,Quad PF,6+ Mos 09/22/2018   PFIZER(Purple Top)SARS-COV-2 Vaccination 02/19/2019, 03/12/2019, 11/16/2019    Health Maintenance  Topic Date Due   COVID-19 Vaccine (4 - Pfizer risk series) 10/10/2021 (Originally 01/11/2020)   INFLUENZA VACCINE  04/04/2022 (Originally 08/04/2021)   TETANUS/TDAP  11/12/2023   Hepatitis C Screening  Completed   HIV Screening  Completed   HPV VACCINES  Aged Out   PAP SMEAR-Modifier  Discontinued    Discussed health benefits of physical activity, and encouraged her to engage in regular exercise appropriate for her age and condition.  1. Annual physical exam Checking labs as below.  Up-to-date on preventative care.  Wellness information provided with AVS. - Lipid panel - COMPLETE METABOLIC PANEL WITH GFR - CBC with Differential/Platelet  2. Cervical cancer screening Pap smear with HPV cotesting completed today. - Cytology - PAP  3. Diabetes mellitus screening Checking hemoglobin A1c. - Hemoglobin A1c; Future  4. Thyroid disorder screen Checking TSH. - TSH  5. Vaginal discharge Thick white discharge consistent with yeast  infection with friable cervix.  Stat wet prep for evaluation. - WET PREP FOR TRICH, YEAST, CLUE  Return in about 1 year (around 09/25/2022) for annual physical exam or sooner if needed.   Christen Butter, NP

## 2021-09-24 NOTE — Addendum Note (Signed)
Addended bySamuel Bouche on: 09/24/2021 12:31 PM   Modules accepted: Orders

## 2021-09-25 LAB — COMPLETE METABOLIC PANEL WITH GFR
AG Ratio: 1.5 (calc) (ref 1.0–2.5)
ALT: 16 U/L (ref 6–29)
AST: 10 U/L (ref 10–30)
Albumin: 4.5 g/dL (ref 3.6–5.1)
Alkaline phosphatase (APISO): 48 U/L (ref 31–125)
BUN: 13 mg/dL (ref 7–25)
CO2: 26 mmol/L (ref 20–32)
Calcium: 9.7 mg/dL (ref 8.6–10.2)
Chloride: 103 mmol/L (ref 98–110)
Creat: 0.66 mg/dL (ref 0.50–0.97)
Globulin: 3 g/dL (calc) (ref 1.9–3.7)
Glucose, Bld: 90 mg/dL (ref 65–99)
Potassium: 4.4 mmol/L (ref 3.5–5.3)
Sodium: 139 mmol/L (ref 135–146)
Total Bilirubin: 0.5 mg/dL (ref 0.2–1.2)
Total Protein: 7.5 g/dL (ref 6.1–8.1)
eGFR: 119 mL/min/{1.73_m2} (ref >=60)

## 2021-09-25 LAB — LIPID PANEL
Cholesterol: 174 mg/dL (ref <200)
HDL: 49 mg/dL — ABNORMAL LOW (ref >=50)
LDL Cholesterol (Calc): 92 mg/dL (calc)
Non-HDL Cholesterol (Calc): 125 mg/dL (calc) (ref <130)
Total CHOL/HDL Ratio: 3.6 (calc) (ref <5.0)
Triglycerides: 248 mg/dL — ABNORMAL HIGH (ref <150)

## 2021-09-25 LAB — CBC WITH DIFFERENTIAL/PLATELET
Absolute Monocytes: 349 cells/uL (ref 200–950)
Basophils Absolute: 42 cells/uL (ref 0–200)
Basophils Relative: 0.5 %
Eosinophils Absolute: 208 cells/uL (ref 15–500)
Eosinophils Relative: 2.5 %
HCT: 37.1 % (ref 35.0–45.0)
Hemoglobin: 12.7 g/dL (ref 11.7–15.5)
Lymphs Abs: 1751 cells/uL (ref 850–3900)
MCH: 30.2 pg (ref 27.0–33.0)
MCHC: 34.2 g/dL (ref 32.0–36.0)
MCV: 88.3 fL (ref 80.0–100.0)
MPV: 10.4 fL (ref 7.5–12.5)
Monocytes Relative: 4.2 %
Neutro Abs: 5951 cells/uL (ref 1500–7800)
Neutrophils Relative %: 71.7 %
Platelets: 263 10*3/uL (ref 140–400)
RBC: 4.2 10*6/uL (ref 3.80–5.10)
RDW: 13 % (ref 11.0–15.0)
Total Lymphocyte: 21.1 %
WBC: 8.3 10*3/uL (ref 3.8–10.8)

## 2021-09-25 LAB — HEMOGLOBIN A1C
Hgb A1c MFr Bld: 4.7 % of total Hgb (ref <5.7)
Mean Plasma Glucose: 88 mg/dL
eAG (mmol/L): 4.9 mmol/L

## 2021-09-25 LAB — TSH: TSH: 1.76 mIU/L

## 2021-09-29 LAB — CYTOLOGY - PAP
Comment: NEGATIVE
Diagnosis: NEGATIVE
High risk HPV: NEGATIVE

## 2021-10-01 ENCOUNTER — Other Ambulatory Visit (HOSPITAL_BASED_OUTPATIENT_CLINIC_OR_DEPARTMENT_OTHER): Payer: Self-pay

## 2021-10-07 ENCOUNTER — Other Ambulatory Visit (HOSPITAL_BASED_OUTPATIENT_CLINIC_OR_DEPARTMENT_OTHER): Payer: Self-pay

## 2021-12-22 ENCOUNTER — Other Ambulatory Visit: Payer: Self-pay | Admitting: Medical-Surgical

## 2021-12-22 DIAGNOSIS — Z3041 Encounter for surveillance of contraceptive pills: Secondary | ICD-10-CM

## 2021-12-23 ENCOUNTER — Other Ambulatory Visit (HOSPITAL_COMMUNITY): Payer: Self-pay

## 2021-12-23 MED ORDER — DESOGESTREL-ETHINYL ESTRADIOL 0.15-0.02/0.01 MG (21/5) PO TABS
1.0000 | ORAL_TABLET | Freq: Every day | ORAL | 4 refills | Status: DC
  Filled 2021-12-23: qty 84, 84d supply, fill #0
  Filled 2022-03-22: qty 84, 84d supply, fill #1
  Filled 2022-06-08: qty 84, 84d supply, fill #2
  Filled 2022-09-02: qty 84, 84d supply, fill #3

## 2022-02-26 DIAGNOSIS — H35413 Lattice degeneration of retina, bilateral: Secondary | ICD-10-CM | POA: Diagnosis not present

## 2022-02-26 DIAGNOSIS — H43393 Other vitreous opacities, bilateral: Secondary | ICD-10-CM | POA: Diagnosis not present

## 2022-02-26 DIAGNOSIS — H53143 Visual discomfort, bilateral: Secondary | ICD-10-CM | POA: Diagnosis not present

## 2022-02-26 DIAGNOSIS — H33322 Round hole, left eye: Secondary | ICD-10-CM | POA: Diagnosis not present

## 2022-02-26 DIAGNOSIS — H33311 Horseshoe tear of retina without detachment, right eye: Secondary | ICD-10-CM | POA: Diagnosis not present

## 2022-03-05 DIAGNOSIS — H33322 Round hole, left eye: Secondary | ICD-10-CM | POA: Diagnosis not present

## 2022-03-18 DIAGNOSIS — H33311 Horseshoe tear of retina without detachment, right eye: Secondary | ICD-10-CM | POA: Diagnosis not present

## 2022-03-24 ENCOUNTER — Other Ambulatory Visit (HOSPITAL_COMMUNITY): Payer: Self-pay

## 2022-03-24 NOTE — Progress Notes (Unsigned)
   Acute Office Visit  Subjective:     Patient ID: Rhonda Rojas, female    DOB: [birth year], 32 y.o.   MRN: [id number]  No chief complaint on file.   HPI Patient is in today for congestion and hearing loss.  ROS      Objective:    There were no vitals taken for this visit.   Physical Exam  No results found for any visits on 03/25/22.      Assessment & Plan:   Problem List Items Addressed This Visit   None   No orders of the defined types were placed in this encounter.   No follow-ups on file.  Owens Loffler, DO

## 2022-03-25 ENCOUNTER — Ambulatory Visit (INDEPENDENT_AMBULATORY_CARE_PROVIDER_SITE_OTHER): Payer: 59 | Admitting: Family Medicine

## 2022-03-25 ENCOUNTER — Encounter: Payer: Self-pay | Admitting: Family Medicine

## 2022-03-25 VITALS — BP 108/68 | HR 81 | Resp 20 | Ht 64.5 in | Wt 177.1 lb

## 2022-03-25 DIAGNOSIS — J069 Acute upper respiratory infection, unspecified: Secondary | ICD-10-CM | POA: Diagnosis not present

## 2022-03-25 DIAGNOSIS — H9193 Unspecified hearing loss, bilateral: Secondary | ICD-10-CM | POA: Diagnosis not present

## 2022-03-25 DIAGNOSIS — R059 Cough, unspecified: Secondary | ICD-10-CM

## 2022-03-25 DIAGNOSIS — R0981 Nasal congestion: Secondary | ICD-10-CM

## 2022-03-25 LAB — POCT INFLUENZA A/B
Influenza A, POC: NEGATIVE
Influenza B, POC: NEGATIVE

## 2022-03-25 LAB — POC COVID19 BINAXNOW: SARS Coronavirus 2 Ag: NEGATIVE

## 2022-03-25 NOTE — Assessment & Plan Note (Signed)
Flu and COVID tested ordered and were negative

## 2022-03-25 NOTE — Assessment & Plan Note (Signed)
-  Based on negative test and symptoms, this sounds like a viral URI.  We discussed supportive care and over-the-counter medications.  I do believe this has to run its course.  Lung exam was normal

## 2022-03-25 NOTE — Assessment & Plan Note (Addendum)
Secondary to cerumen impaction in her right ear as well as fluid seen on the TM in her left ear.  Recommended daily allergy medication such as Zyrtec.  I believe all this is related to her viral URI.  recommended supportive care Ear wash done and did get some cerumen out.

## 2022-03-25 NOTE — Patient Instructions (Signed)
I believe that you are having is viral in nature try to increase vitamin C, zinc, fluids and rest.  For the fluid seen on your ears today please try taking a daily allergy medication such as Zyrtec.  If your symptoms worsen or fail to improve please reach back out to our office.

## 2022-04-07 ENCOUNTER — Telehealth: Payer: 59 | Admitting: Physician Assistant

## 2022-04-07 DIAGNOSIS — M545 Low back pain, unspecified: Secondary | ICD-10-CM

## 2022-04-07 MED ORDER — CYCLOBENZAPRINE HCL 10 MG PO TABS: 5.0000 mg | ORAL_TABLET | Freq: Three times a day (TID) | ORAL | 0 refills | Status: DC | PRN

## 2022-04-07 MED ORDER — NAPROXEN 500 MG PO TABS: 500.0000 mg | ORAL_TABLET | Freq: Two times a day (BID) | ORAL | 0 refills | Status: DC

## 2022-04-07 NOTE — Progress Notes (Signed)

## 2022-04-22 ENCOUNTER — Ambulatory Visit (INDEPENDENT_AMBULATORY_CARE_PROVIDER_SITE_OTHER): Payer: 59 | Admitting: Family Medicine

## 2022-04-22 ENCOUNTER — Encounter: Payer: Self-pay | Admitting: Family Medicine

## 2022-04-22 ENCOUNTER — Other Ambulatory Visit (HOSPITAL_COMMUNITY): Payer: Self-pay

## 2022-04-22 VITALS — BP 121/59 | HR 85 | Temp 98.3°F | Ht 64.5 in | Wt 178.0 lb

## 2022-04-22 DIAGNOSIS — J302 Other seasonal allergic rhinitis: Secondary | ICD-10-CM

## 2022-04-22 DIAGNOSIS — R051 Acute cough: Secondary | ICD-10-CM | POA: Diagnosis not present

## 2022-04-22 MED ORDER — LEVOCETIRIZINE DIHYDROCHLORIDE 5 MG PO TABS
5.0000 mg | ORAL_TABLET | Freq: Every evening | ORAL | 3 refills | Status: DC
Start: 2022-04-22 — End: 2022-07-27
  Filled 2022-04-22: qty 30, 30d supply, fill #0

## 2022-04-22 MED ORDER — HYDROCOD POLI-CHLORPHE POLI ER 10-8 MG/5ML PO SUER
5.0000 mL | Freq: Two times a day (BID) | ORAL | 0 refills | Status: DC | PRN
Start: 2022-04-22 — End: 2022-07-27
  Filled 2022-04-22: qty 120, 12d supply, fill #0

## 2022-04-22 NOTE — Progress Notes (Signed)
Established patient visit   Patient: Rhonda Rojas   DOB: Nov 30, 1990   32 y.o. Female  MRN: [id number] Visit Date: 04/22/2022  Today's healthcare provider: Charlton Amor, DO   Chief Complaint  Patient presents with   Cough    Started last Tuesday. Cough is productive. Currently taking Mucinex Day and night.  Covid test was negative.     SUBJECTIVE    Chief Complaint  Patient presents with   Cough    Started last Tuesday. Cough is productive. Currently taking Mucinex Day and night.  Covid test was negative.    HPI Pt presents with persistent cough.  Since our last visit together she said she was getting better and then got worse again this week.  She is currently taking Mucinex daily and has noticed productive sputum.  She did test for COVID and was negative.  Admit to difficulty sleeping due to cough.  Review of Systems  Constitutional:  Negative for activity change, fatigue and fever.  Respiratory:  Positive for cough. Negative for shortness of breath.   Cardiovascular:  Negative for chest pain.  Gastrointestinal:  Negative for abdominal pain.  Genitourinary:  Negative for difficulty urinating.       Current Meds  Medication Sig   chlorpheniramine-HYDROcodone (TUSSIONEX) 10-8 MG/5ML Take 5 mLs by mouth every 12 (twelve) hours as needed for cough (cough, will cause drowsiness.).   desogestrel-ethinyl estradiol (VOLNEA) 0.15-0.02/0.01 MG (21/5) tablet TAKE 1 TABLET BY MOUTH DAILY.   levocetirizine (XYZAL) 5 MG tablet Take 1 tablet (5 mg total) by mouth every evening.   Multiple Vitamin (MULTIVITAMIN ADULT PO) Take by mouth.    OBJECTIVE    BP (!) 121/59   Pulse 85   Temp 98.3 F (36.8 C) (Oral)   Ht 5' 4.5" (1.638 m)   Wt 178 lb 0.6 oz (80.8 kg)   SpO2 100%   BMI 30.09 kg/m   Physical Exam Vitals and nursing note reviewed.  Constitutional:      General: She is not in acute distress.    Appearance: Normal appearance.  HENT:     Head: Normocephalic  and atraumatic.     Right Ear: External ear normal.     Left Ear: External ear normal.     Nose: Nose normal.  Eyes:     Conjunctiva/sclera: Conjunctivae normal.  Cardiovascular:     Rate and Rhythm: Normal rate and regular rhythm.  Pulmonary:     Effort: Pulmonary effort is normal.     Breath sounds: Normal breath sounds.  Neurological:     General: No focal deficit present.     Mental Status: She is alert and oriented to person, place, and time.  Psychiatric:        Mood and Affect: Mood normal.        Behavior: Behavior normal.        Thought Content: Thought content normal.        Judgment: Judgment normal.          ASSESSMENT & PLAN    Problem List Items Addressed This Visit       Other   Acute cough - Primary    - Believe this is viral in nature being that it started 2 days ago.  I have gone ahead and discussed supportive therapy with patient.  We will also give her a prescription of Tussionex to use.  PMP was reviewed with no red flags.  I believe helping control her cough  at night will help her sleep and feel better.  Lung exam was completely normal with no findings of wheezing. -So discussed continuing Mucinex and doing warm tea with honey and other supportive care.      Relevant Medications   chlorpheniramine-HYDROcodone (TUSSIONEX) 10-8 MG/5ML   Seasonal allergies    Has a high of seasonal allergies secondary to pollen.  She has not been able to find anything that helps control her symptoms.  I believe she is also experiencing some postnasal drip that is triggering a cough.  I have sent in for Xyzal and advised her to take daily.  Have also discussed continuing use of an over-the-counter nasal spray.      Relevant Medications   levocetirizine (XYZAL) 5 MG tablet    Return if symptoms worsen or fail to improve.      Meds ordered this encounter  Medications   chlorpheniramine-HYDROcodone (TUSSIONEX) 10-8 MG/5ML    Sig: Take 5 mLs by mouth every 12 (twelve)  hours as needed for cough (cough, will cause drowsiness.).    Dispense:  120 mL    Refill:  0   levocetirizine (XYZAL) 5 MG tablet    Sig: Take 1 tablet (5 mg total) by mouth every evening.    Dispense:  30 tablet    Refill:  3    No orders of the defined types were placed in this encounter.    Charlton Amor, DO  [facility] Medicine at [facility] [phone] (phone) [phone] (fax)  [facility]

## 2022-04-22 NOTE — Assessment & Plan Note (Signed)
Has a high of seasonal allergies secondary to pollen.  She has not been able to find anything that helps control her symptoms.  I believe she is also experiencing some postnasal drip that is triggering a cough.  I have sent in for Xyzal and advised her to take daily.  Have also discussed continuing use of an over-the-counter nasal spray.

## 2022-04-22 NOTE — Patient Instructions (Signed)
-   cough: warm tea with honey Take tussionex  Mucinex

## 2022-04-22 NOTE — Assessment & Plan Note (Addendum)
-   Believe this is viral in nature being that it started 2 days ago.  I have gone ahead and discussed supportive therapy with patient.  We will also give her a prescription of Tussionex to use.  PMP was reviewed with no red flags.  I believe helping control her cough at night will help her sleep and feel better.  Lung exam was completely normal with no findings of wheezing. -So discussed continuing Mucinex and doing warm tea with honey and other supportive care.

## 2022-07-09 DIAGNOSIS — H43391 Other vitreous opacities, right eye: Secondary | ICD-10-CM | POA: Diagnosis not present

## 2022-07-09 DIAGNOSIS — H31093 Other chorioretinal scars, bilateral: Secondary | ICD-10-CM | POA: Diagnosis not present

## 2022-07-09 DIAGNOSIS — H33323 Round hole, bilateral: Secondary | ICD-10-CM | POA: Diagnosis not present

## 2022-07-27 ENCOUNTER — Encounter: Payer: Self-pay | Admitting: Medical-Surgical

## 2022-07-27 ENCOUNTER — Ambulatory Visit (INDEPENDENT_AMBULATORY_CARE_PROVIDER_SITE_OTHER): Payer: 59 | Admitting: Medical-Surgical

## 2022-07-27 VITALS — BP 108/69 | HR 84 | Resp 20 | Ht 64.5 in | Wt 179.8 lb

## 2022-07-27 DIAGNOSIS — N898 Other specified noninflammatory disorders of vagina: Secondary | ICD-10-CM | POA: Diagnosis not present

## 2022-07-27 LAB — POCT URINALYSIS DIP (CLINITEK)
Bilirubin, UA: NEGATIVE
Glucose, UA: NEGATIVE mg/dL
Ketones, POC UA: NEGATIVE mg/dL
Nitrite, UA: NEGATIVE
POC PROTEIN,UA: NEGATIVE
Spec Grav, UA: 1.01 (ref 1.010–1.025)
Urobilinogen, UA: 0.2 E.U./dL
pH, UA: 6 (ref 5.0–8.0)

## 2022-07-27 NOTE — Progress Notes (Signed)
        Established patient visit  History, exam, impression, and plan:  1. Vaginal discharge Pleasant 32 year old female presenting today with reports of irregular vaginal discharge.  Notes that she had her last menstrual cycle started on 7/4 with normal flow and length of time.  Unfortunately, she experienced a little bit of vaginal discharge prior to her cycle starting and continued with brown spotting discoloration for several days after.  Notes that she used to have issues with heavy menstrual bleeding however recently this has been well-controlled.  No vaginal irritation, dyspareunia, itching, or urinary symptoms.  When her discharge for started, she did note a fishy odor however this has resolved.  POCT urinalysis positive for moderate blood with small leukocytes.  Sending for culture.  Stat wet prep to evaluate for bacterial vaginosis, yeast, and trichomonas.  Denies risk for STI however we will go ahead and check for gonorrhea and chlamydia due to abnormal discharge.  No concerning symptoms today so we will hold off on treatment until results come back.  Patient verbalized understanding is agreeable to plan. - WET PREP FOR TRICH, YEAST, CLUE - GC/Chlamydia Probe Amp - Urine Culture - POCT URINALYSIS DIP (CLINITEK)   Procedures performed this visit: None.  Return if symptoms worsen or fail to improve.  __________________________________ Thayer Ohm, DNP, APRN, FNP-BC Primary Care and Sports Medicine [facility] [place]

## 2022-07-28 ENCOUNTER — Other Ambulatory Visit (HOSPITAL_COMMUNITY): Payer: Self-pay

## 2022-07-28 LAB — WET PREP FOR TRICH, YEAST, CLUE
Clue Cell Exam: NEGATIVE
Trichomonas Exam: NEGATIVE
Yeast Exam: POSITIVE — AB

## 2022-07-28 MED ORDER — FLUCONAZOLE 150 MG PO TABS
150.0000 mg | ORAL_TABLET | Freq: Once | ORAL | 0 refills | Status: AC
  Filled 2022-07-28: qty 2, 3d supply, fill #0

## 2022-07-28 NOTE — Addendum Note (Signed)
Addended byChristen Butter on: 07/28/2022 03:09 PM   Modules accepted: Orders

## 2022-07-29 LAB — URINE CULTURE

## 2022-09-17 DIAGNOSIS — H33321 Round hole, right eye: Secondary | ICD-10-CM | POA: Diagnosis not present

## 2022-09-27 ENCOUNTER — Encounter: Payer: 59 | Admitting: Medical-Surgical

## 2022-10-04 DIAGNOSIS — H33322 Round hole, left eye: Secondary | ICD-10-CM | POA: Diagnosis not present

## 2022-10-12 ENCOUNTER — Ambulatory Visit (INDEPENDENT_AMBULATORY_CARE_PROVIDER_SITE_OTHER): Payer: 59 | Admitting: Medical-Surgical

## 2022-10-12 ENCOUNTER — Other Ambulatory Visit (HOSPITAL_COMMUNITY): Payer: Self-pay

## 2022-10-12 ENCOUNTER — Encounter: Payer: Self-pay | Admitting: Medical-Surgical

## 2022-10-12 VITALS — BP 122/69 | HR 86 | Resp 20 | Ht 64.5 in | Wt 179.2 lb

## 2022-10-12 DIAGNOSIS — Z23 Encounter for immunization: Secondary | ICD-10-CM

## 2022-10-12 DIAGNOSIS — Z0000 Encounter for general adult medical examination without abnormal findings: Secondary | ICD-10-CM

## 2022-10-12 DIAGNOSIS — Z3041 Encounter for surveillance of contraceptive pills: Secondary | ICD-10-CM | POA: Diagnosis not present

## 2022-10-12 DIAGNOSIS — Z111 Encounter for screening for respiratory tuberculosis: Secondary | ICD-10-CM

## 2022-10-12 DIAGNOSIS — Z0184 Encounter for antibody response examination: Secondary | ICD-10-CM

## 2022-10-12 MED ORDER — DESOGESTREL-ETHINYL ESTRADIOL 0.15-0.02/0.01 MG (21/5) PO TABS
1.0000 | ORAL_TABLET | Freq: Every day | ORAL | 4 refills | Status: DC
  Filled 2022-10-12 – 2022-11-24 (×2): qty 84, 84d supply, fill #0
  Filled 2023-02-13: qty 84, 84d supply, fill #1
  Filled 2023-05-08: qty 84, 84d supply, fill #2
  Filled 2023-07-30: qty 84, 84d supply, fill #3

## 2022-10-12 NOTE — Progress Notes (Signed)
Complete physical exam  Patient: Rhonda Rojas   DOB: 11-05-1990   32 y.o. Female  MRN: [id number]  Subjective:    Chief Complaint  Patient presents with   Annual Exam    Rhonda Rojas Resor is a 32 y.o. female who presents today for a complete physical exam. She reports consuming a general diet. The patient does not participate in regular exercise at present. She generally feels well. She reports sleeping well. She does not have additional problems to discuss today.    Most recent fall risk assessment:    04/22/2022    2:40 PM  Fall Risk   Falls in the past year? 0  Number falls in past yr: 0  Injury with Fall? 0  Risk for fall due to : No Fall Risks  Follow up Falls evaluation completed     Most recent depression screenings:    04/22/2022    2:40 PM 03/25/2022    8:21 AM  PHQ 2/9 Scores  PHQ - 2 Score 0 0    Vision:Within last year, Dental: No current dental problems and Receives regular dental care, and STD: The patient denies history of sexually transmitted disease.    Patient Care Team: Christen Butter, NP as PCP - General (Nurse Practitioner) [name], MD (Inactive) as Consulting Physician (Dermatology) Glyn Ade, PA-C as Physician Assistant (Dermatology)   Outpatient Medications Prior to Visit  Medication Sig   Multiple Vitamin (MULTIVITAMIN ADULT PO) Take by mouth.   [DISCONTINUED] desogestrel-ethinyl estradiol (VOLNEA) 0.15-0.02/0.01 MG (21/5) tablet TAKE 1 TABLET BY MOUTH DAILY.   No facility-administered medications prior to visit.    Review of Systems  Constitutional:  Positive for malaise/fatigue. Negative for chills, fever and weight loss.  HENT:  Negative for congestion, ear pain, hearing loss, sinus pain and sore throat.   Eyes:  Negative for blurred vision, photophobia and pain.  Respiratory:  Negative for cough, shortness of breath and wheezing.   Cardiovascular:  Negative for chest pain, palpitations and leg swelling.   Gastrointestinal:  Negative for abdominal pain, constipation, diarrhea, heartburn, nausea and vomiting.  Genitourinary:  Negative for dysuria, frequency and urgency.  Musculoskeletal:  Negative for falls and neck pain.  Skin:  Negative for itching and rash.  Neurological:  Negative for dizziness, weakness and headaches.  Endo/Heme/Allergies:  Negative for polydipsia. Does not bruise/bleed easily.  Psychiatric/Behavioral:  Negative for depression, substance abuse and suicidal ideas. The patient is not nervous/anxious.      Objective:    BP 122/69 (BP Location: Right Arm, Cuff Size: Normal)   Pulse 86   Resp 20   Ht 5' 4.5" (1.638 m)   Wt 179 lb 3.2 oz (81.3 kg)   SpO2 100%   BMI 30.28 kg/m    Physical Exam Vitals reviewed.  Constitutional:      General: She is not in acute distress.    Appearance: Normal appearance. She is not ill-appearing.  HENT:     Head: Normocephalic and atraumatic.     Right Ear: Tympanic membrane, ear canal and external ear normal. There is no impacted cerumen.     Left Ear: Tympanic membrane, ear canal and external ear normal. There is no impacted cerumen.     Nose: Nose normal. No congestion or rhinorrhea.     Mouth/Throat:     Mouth: Mucous membranes are moist.     Pharynx: No oropharyngeal exudate or posterior oropharyngeal erythema.  Eyes:     General: No scleral icterus.  Right eye: No discharge.        Left eye: No discharge.     Extraocular Movements: Extraocular movements intact.     Conjunctiva/sclera: Conjunctivae normal.     Pupils: Pupils are equal, round, and reactive to light.  Neck:     Thyroid: No thyromegaly.     Vascular: No carotid bruit or JVD.     Trachea: Trachea normal.  Cardiovascular:     Rate and Rhythm: Normal rate and regular rhythm.     Pulses: Normal pulses.     Heart sounds: Normal heart sounds. No murmur heard.    No friction rub. No gallop.  Pulmonary:     Effort: Pulmonary effort is normal. No  respiratory distress.     Breath sounds: Normal breath sounds. No wheezing.  Abdominal:     General: Bowel sounds are normal. There is no distension.     Palpations: Abdomen is soft.     Tenderness: There is no abdominal tenderness. There is no guarding.  Musculoskeletal:        General: Normal range of motion.     Cervical back: Normal range of motion and neck supple.  Lymphadenopathy:     Cervical: No cervical adenopathy.  Skin:    General: Skin is warm and dry.  Neurological:     Mental Status: She is alert and oriented to person, place, and time.     Cranial Nerves: No cranial nerve deficit.  Psychiatric:        Mood and Affect: Mood normal.        Behavior: Behavior normal.        Thought Content: Thought content normal.        Judgment: Judgment normal.   No results found for any visits on 10/12/22.     Assessment & Plan:    Routine Health Maintenance and Physical Exam  Immunization History  Administered Date(s) Administered   Influenza,inj,Quad PF,6+ Mos 09/22/2018   PFIZER(Purple Top)SARS-COV-2 Vaccination 02/19/2019, 03/12/2019, 11/16/2019    Health Maintenance  Topic Date Due   DTaP/Tdap/Td (1 - Tdap) Never done   INFLUENZA VACCINE  08/05/2022   COVID-19 Vaccine (4 - 2023-24 season) 10/28/2022 (Originally 09/05/2022)   Cervical Cancer Screening (HPV/Pap Cotest)  09/25/2026   Hepatitis C Screening  Completed   HIV Screening  Completed   HPV VACCINES  Aged Out    Discussed health benefits of physical activity, and encouraged her to engage in regular exercise appropriate for her age and condition.  1. Annual physical exam Checking labs as below up-to-date on preventative care.  Wellness information provided with AVS. - CBC with Differential/Platelet - CMP14+EGFR  2. Need for influenza vaccination Flu vaccine given in office today. - Flu vaccine trivalent PF, 6mos and older(Flulaval,Afluria,Fluarix,Fluzone)  3. Need for tetanus booster Tetanus given in  office today. - Tdap vaccine greater than or equal to 7yo IM  4. Screening-pulmonary TB QuantiFERON gold for TB screening as required by school form. - QuantiFERON-TB Gold Plus  5. Immunity status testing Checking immunity status for varicella and MMR. - Varicella Zoster Abs, IgG/IgM - Measles/Mumps/Rubella Immunity  Return in about 1 year (around 10/12/2023) for annual physical exam or sooner if needed.   Christen Butter, NP

## 2022-10-12 NOTE — Patient Instructions (Signed)

## 2022-10-13 ENCOUNTER — Encounter: Payer: Self-pay | Admitting: Medical-Surgical

## 2022-10-14 LAB — QUANTIFERON-TB GOLD PLUS
QuantiFERON Mitogen Value: >10 [IU]/mL
QuantiFERON Nil Value: 0.01 [IU]/mL
QuantiFERON TB1 Ag Value: 0.01 [IU]/mL
QuantiFERON TB2 Ag Value: 0.01 [IU]/mL
QuantiFERON-TB Gold Plus: NEGATIVE

## 2022-10-14 LAB — CBC WITH DIFFERENTIAL/PLATELET
Basophils Absolute: 0 10*3/uL (ref 0.0–0.2)
Basos: 1 %
EOS (ABSOLUTE): 0.2 10*3/uL (ref 0.0–0.4)
Eos: 3 %
Hematocrit: 41.6 % (ref 34.0–46.6)
Hemoglobin: 13.5 g/dL (ref 11.1–15.9)
Immature Grans (Abs): 0 10*3/uL (ref 0.0–0.1)
Immature Granulocytes: 0 %
Lymphocytes Absolute: 1.9 10*3/uL (ref 0.7–3.1)
Lymphs: 26 %
MCH: 29.7 pg (ref 26.6–33.0)
MCHC: 32.5 g/dL (ref 31.5–35.7)
MCV: 92 fL (ref 79–97)
Monocytes Absolute: 0.4 10*3/uL (ref 0.1–0.9)
Monocytes: 5 %
Neutrophils Absolute: 4.7 10*3/uL (ref 1.4–7.0)
Neutrophils: 65 %
Platelets: 236 10*3/uL (ref 150–450)
RBC: 4.54 x10E6/uL (ref 3.77–5.28)
RDW: 13 % (ref 11.7–15.4)
WBC: 7.3 10*3/uL (ref 3.4–10.8)

## 2022-10-14 LAB — CMP14+EGFR
ALT: 18 [IU]/L (ref 0–32)
AST: 12 [IU]/L (ref 0–40)
Albumin: 4.4 g/dL (ref 3.9–4.9)
Alkaline Phosphatase: 50 [IU]/L (ref 44–121)
BUN/Creatinine Ratio: 17 (ref 9–23)
BUN: 13 mg/dL (ref 6–20)
Bilirubin Total: 0.2 mg/dL (ref 0.0–1.2)
CO2: 20 mmol/L (ref 20–29)
Calcium: 9.4 mg/dL (ref 8.7–10.2)
Chloride: 104 mmol/L (ref 96–106)
Creatinine, Ser: 0.78 mg/dL (ref 0.57–1.00)
Globulin, Total: 2.8 g/dL (ref 1.5–4.5)
Glucose: 93 mg/dL (ref 70–99)
Potassium: 4.4 mmol/L (ref 3.5–5.2)
Sodium: 139 mmol/L (ref 134–144)
Total Protein: 7.2 g/dL (ref 6.0–8.5)
eGFR: 103 mL/min/{1.73_m2} (ref >59)

## 2022-10-14 LAB — MEASLES/MUMPS/RUBELLA IMMUNITY
MUMPS ABS, IGG: 40.5 [AU]/ml (ref >10.9)
RUBEOLA AB, IGG: <13.5 [AU]/ml — ABNORMAL LOW (ref >16.4)
Rubella Antibodies, IGG: 3.13 {index} (ref >0.99)

## 2022-10-14 LAB — VARICELLA ZOSTER ABS, IGG/IGM
Varicella IgM: <0.91 {index} (ref 0.00–0.90)
Varicella zoster IgG: REACTIVE

## 2022-10-18 NOTE — Telephone Encounter (Signed)
Filled out the forms the best that I could. Placed in your form folder.

## 2022-10-19 NOTE — Telephone Encounter (Signed)
Sent MyChart message and made a copy for our records and for scan. Placed the originals at the front desk.

## 2022-11-24 ENCOUNTER — Other Ambulatory Visit (HOSPITAL_COMMUNITY): Payer: Self-pay

## 2022-12-07 ENCOUNTER — Telehealth: Payer: 59 | Admitting: Physician Assistant

## 2022-12-07 DIAGNOSIS — N898 Other specified noninflammatory disorders of vagina: Secondary | ICD-10-CM | POA: Diagnosis not present

## 2022-12-07 MED ORDER — FLUCONAZOLE 150 MG PO TABS
150.0000 mg | ORAL_TABLET | Freq: Every day | ORAL | 0 refills | Status: DC
Start: 2022-12-07 — End: 2023-03-21

## 2022-12-07 NOTE — Progress Notes (Signed)

## 2022-12-14 ENCOUNTER — Other Ambulatory Visit (HOSPITAL_COMMUNITY): Payer: Self-pay

## 2022-12-14 ENCOUNTER — Ambulatory Visit
Admission: EM | Admit: 2022-12-14 | Discharge: 2022-12-14 | Disposition: A | Discharge status: Home / Self Care | Payer: 59

## 2022-12-14 ENCOUNTER — Ambulatory Visit: Payer: 59

## 2022-12-14 DIAGNOSIS — M5442 Lumbago with sciatica, left side: Secondary | ICD-10-CM

## 2022-12-14 DIAGNOSIS — M62830 Muscle spasm of back: Secondary | ICD-10-CM | POA: Diagnosis not present

## 2022-12-14 DIAGNOSIS — M545 Low back pain, unspecified: Secondary | ICD-10-CM

## 2022-12-14 DIAGNOSIS — M5441 Lumbago with sciatica, right side: Secondary | ICD-10-CM | POA: Diagnosis not present

## 2022-12-14 MED ORDER — CELECOXIB 200 MG PO CAPS
200.0000 mg | ORAL_CAPSULE | Freq: Every day | ORAL | 0 refills | Status: AC
Start: 2022-12-14 — End: 2022-12-29
  Filled 2022-12-14: qty 15, 15d supply, fill #0

## 2022-12-14 MED ORDER — PREDNISONE 10 MG PO TABS
ORAL_TABLET | Freq: Every day | ORAL | 0 refills | Status: DC
  Filled 2022-12-14: qty 42, 12d supply, fill #0

## 2022-12-14 MED ORDER — OXYCODONE-ACETAMINOPHEN 5-325 MG PO TABS
1.0000 | ORAL_TABLET | Freq: Three times a day (TID) | ORAL | 0 refills | Status: DC | PRN
  Filled 2022-12-14: qty 15, 5d supply, fill #0

## 2022-12-14 MED ORDER — METHOCARBAMOL 500 MG PO TABS
500.0000 mg | ORAL_TABLET | Freq: Three times a day (TID) | ORAL | 0 refills | Status: DC | PRN
  Filled 2022-12-14: qty 30, 10d supply, fill #0

## 2022-12-14 NOTE — Discharge Instructions (Addendum)
Advised patient to take medication as directed with food to completion.  Advised may use Robaxin daily or as needed for accompanying muscle spasms of back.  Advised may use Percocet for severe/acute lower back pain.  Patient advised of sedative effects of this medication.  Encouraged to increase daily water intake to 64 ounces per day while taking these medications.  Advised if symptoms worsen and/or unresolved please follow-up with PCP, [facility], or here for further evaluation.

## 2022-12-14 NOTE — ED Triage Notes (Signed)
Pt c/o lower back pain since yesterday. Hurt her back similarly in April. Was given Naproxen and flexeril which she's been taking prn.

## 2022-12-14 NOTE — ED Provider Notes (Signed)
[facility]    CSN: [id number] Arrival date & time: 12/14/22  0809      History   Chief Complaint Chief Complaint  Patient presents with   Back Pain    HPI Rhonda Rojas is a 32 y.o. female.   HPI pleasant 32 year old female presents with lower back pain for 1 day.  Reports hurting her back with similar experience in April of this year.  Reports was prescribed naproxen and Flexeril which she has been taking as needed.  PMH significant for obesity, fatigue, and vitamin D deficiency  Past Medical History:  Diagnosis Date   Fatigue    Vitamin D deficiency     Patient Active Problem List   Diagnosis Date Noted   Seasonal allergies 04/22/2022   Low serum ferritin level 06/23/2018   Encounter for surveillance of contraceptive pills 06/23/2018   Cystic acne vulgaris 11/11/2017   Fatigue     Past Surgical History:  Procedure Laterality Date   TONSILLECTOMY      OB History     Gravida  2   Para  2   Term      Preterm      AB      Living  2      SAB      IAB      Ectopic      Multiple      Live Births               Home Medications    Prior to Admission medications   Medication Sig Start Date End Date Taking? Authorizing Provider  celecoxib (CELEBREX) 200 MG capsule Take 1 capsule (200 mg total) by mouth daily for 15 days. 12/14/22 12/29/22 Yes Trevor Iha, FNP  methocarbamol (ROBAXIN) 500 MG tablet Take 1 tablet (500 mg total) by mouth 3 (three) times daily as needed. 12/14/22  Yes Trevor Iha, FNP  oxyCODONE-acetaminophen (PERCOCET/ROXICET) 5-325 MG tablet Take 1 tablet by mouth every 8 (eight) hours as needed for severe pain (pain score 7-10). 12/14/22  Yes Trevor Iha, FNP  predniSONE (STERAPRED UNI-PAK 21 TAB) 10 MG (21) TBPK tablet Take by mouth daily. Take 6 tabs by mouth daily  for 2 days, then 5 tabs for 2 days, then 4 tabs for 2 days, then 3 tabs for 2 days, 2 tabs for 2 days, then 1 tab by mouth daily for 2  days 12/14/22  Yes Trevor Iha, FNP  desogestrel-ethinyl estradiol (VOLNEA) 0.15-0.02/0.01 MG (21/5) tablet Take 1 tablet by mouth daily. 10/12/22   Christen Butter, NP  fluconazole (DIFLUCAN) 150 MG tablet Take 1 tablet (150 mg total) by mouth daily. 12/07/22   Ward, Tylene Fantasia, PA-C  Multiple Vitamin (MULTIVITAMIN ADULT PO) Take by mouth.    [provider]    Family History Family History  Problem Relation Age of Onset   Healthy Mother    Healthy Father     Social History Social History   Tobacco Use   Smoking status: Never   Smokeless tobacco: Never  Vaping Use   Vaping status: Never Used  Substance Use Topics   Alcohol use: Yes    Comment: Occasionally   Drug use: Never     Allergies   Patient has no known allergies.   Review of Systems Review of Systems  Musculoskeletal:  Positive for back pain.     Physical Exam Triage Vital Signs ED Triage Vitals  Encounter Vitals Group     BP 12/14/22 0837  127/79     Systolic BP Percentile --      Diastolic BP Percentile --      Pulse Rate 12/14/22 0837 90     Resp 12/14/22 0837 17     Temp 12/14/22 0837 98.4 F (36.9 C)     Temp Source 12/14/22 0837 Oral     SpO2 12/14/22 0837 100 %     Weight --      Height --      Head Circumference --      Peak Flow --      Pain Score 12/14/22 0838 6     Pain Loc --      Pain Education --      Exclude from Growth Chart --    No data found.  Updated Vital Signs BP 127/79 (BP Location: Right Arm)   Pulse 90   Temp 98.4 F (36.9 C) (Oral)   Resp 17   LMP 11/18/2022 (Approximate)   SpO2 100%    Physical Exam Vitals and nursing note reviewed.  Constitutional:      Appearance: Normal appearance. She is obese. She is ill-appearing.  HENT:     Head: Normocephalic and atraumatic.     Mouth/Throat:     Mouth: Mucous membranes are moist.     Pharynx: Oropharynx is clear.  Eyes:     Extraocular Movements: Extraocular movements intact.     Conjunctiva/sclera:  Conjunctivae normal.     Pupils: Pupils are equal, round, and reactive to light.  Cardiovascular:     Rate and Rhythm: Normal rate and regular rhythm.     Pulses: Normal pulses.     Heart sounds: Normal heart sounds.  Pulmonary:     Effort: Pulmonary effort is normal.     Breath sounds: Normal breath sounds. No wheezing, rhonchi or rales.  Abdominal:     Tenderness: There is no right CVA tenderness or left CVA tenderness.  Musculoskeletal:        General: Normal range of motion.     Cervical back: Normal range of motion and neck supple.     Comments: Lumbar spine (central inferior aspect) TTP over spinous processes bilateral spinous muscles, palpable bilateral muscle adhesions noted with patient reporting pain radiating to bilateral upper legs  Skin:    General: Skin is warm and dry.  Neurological:     General: No focal deficit present.     Mental Status: She is alert and oriented to person, place, and time.  Psychiatric:        Mood and Affect: Mood normal.        Behavior: Behavior normal.        Thought Content: Thought content normal.      UC Treatments / Results  Labs (all labs ordered are listed, but only abnormal results are displayed) Labs Reviewed - No data to display  EKG   Radiology No results found.  Procedures Procedures (including critical care time)  Medications Ordered in UC Medications - No data to display  Initial Impression / Assessment and Plan / UC Course  I have reviewed the triage vital signs and the nursing notes.  Pertinent labs & imaging results that were available during my care of the patient were reviewed by me and considered in my medical decision making (see chart for details).     MDM: 1.  Acute midline low back pain with bilateral sciatica-Rx'd Sterapred Unipak (tapering from 60 mg to 10 mg over 10 days), Rx'd Celebrex  200 mg capsule: Take 1 capsule p.o. daily x 15 days, Rx'd Percocet 5/325 mg tablet: Take 1 tablet by mouth every 8  hours for severe/acute low back pain; 2.  Muscle spasm of the back-Rx'd Robaxin 500 mg tablet: Take 1 tablet 3 times daily, as needed for muscle spasms of back. Advised patient to take medication as directed with food to completion.  Advised may use Robaxin daily or as needed for accompanying muscle spasms of back.  Advised may use Percocet for severe/acute lower back pain.  Patient advised of sedative effects of this medication.  Encouraged to increase daily water intake to 64 ounces per day while taking these medications.  Advised if symptoms worsen and/or unresolved please follow-up with PCP, [facility], or here for further evaluation.  Discharged home, hemodynamically stable. Final Clinical Impressions(s) / UC Diagnoses   Final diagnoses:  Muscle spasm of back  Acute midline low back pain with bilateral sciatica     Discharge Instructions      Advised patient to take medication as directed with food to completion.  Advised may use Robaxin daily or as needed for accompanying muscle spasms of back.  Advised may use Percocet for severe/acute lower back pain.  Patient advised of sedative effects of this medication.  Encouraged to increase daily water intake to 64 ounces per day while taking these medications.  Advised if symptoms worsen and/or unresolved please follow-up with PCP, [facility], or here for further evaluation.     ED Prescriptions     Medication Sig Dispense Auth. Provider   predniSONE (STERAPRED UNI-PAK 21 TAB) 10 MG (21) TBPK tablet Take by mouth daily. Take 6 tabs by mouth daily  for 2 days, then 5 tabs for 2 days, then 4 tabs for 2 days, then 3 tabs for 2 days, 2 tabs for 2 days, then 1 tab by mouth daily for 2 days 42 tablet Trevor Iha, FNP   celecoxib (CELEBREX) 200 MG capsule Take 1 capsule (200 mg total) by mouth daily for 15 days. 15 capsule Trevor Iha, FNP   methocarbamol (ROBAXIN) 500 MG tablet Take 1 tablet (500 mg total) by mouth 3  (three) times daily as needed. 30 tablet Trevor Iha, FNP   oxyCODONE-acetaminophen (PERCOCET/ROXICET) 5-325 MG tablet Take 1 tablet by mouth every 8 (eight) hours as needed for severe pain (pain score 7-10). 15 tablet Trevor Iha, FNP      I have reviewed the PDMP during this encounter.   Trevor Iha, FNP 12/14/22 1015

## 2023-01-19 DIAGNOSIS — H43391 Other vitreous opacities, right eye: Secondary | ICD-10-CM | POA: Diagnosis not present

## 2023-01-19 DIAGNOSIS — H59813 Chorioretinal scars after surgery for detachment, bilateral: Secondary | ICD-10-CM | POA: Diagnosis not present

## 2023-02-13 ENCOUNTER — Other Ambulatory Visit: Payer: Self-pay

## 2023-02-25 ENCOUNTER — Ambulatory Visit: Payer: Commercial Managed Care - PPO | Admitting: Medical-Surgical

## 2023-02-25 DIAGNOSIS — R112 Nausea with vomiting, unspecified: Secondary | ICD-10-CM | POA: Diagnosis not present

## 2023-02-25 DIAGNOSIS — E860 Dehydration: Secondary | ICD-10-CM | POA: Diagnosis not present

## 2023-02-25 DIAGNOSIS — N390 Urinary tract infection, site not specified: Secondary | ICD-10-CM | POA: Diagnosis not present

## 2023-02-25 DIAGNOSIS — R109 Unspecified abdominal pain: Secondary | ICD-10-CM | POA: Diagnosis not present

## 2023-02-25 DIAGNOSIS — R55 Syncope and collapse: Secondary | ICD-10-CM | POA: Diagnosis not present

## 2023-02-25 DIAGNOSIS — Z789 Other specified health status: Secondary | ICD-10-CM | POA: Diagnosis not present

## 2023-03-01 ENCOUNTER — Encounter: Payer: Self-pay | Admitting: Medical-Surgical

## 2023-03-01 ENCOUNTER — Ambulatory Visit (INDEPENDENT_AMBULATORY_CARE_PROVIDER_SITE_OTHER): Payer: Commercial Managed Care - PPO | Admitting: Medical-Surgical

## 2023-03-01 VITALS — BP 109/69 | HR 68 | Resp 20 | Ht 64.5 in | Wt 181.8 lb

## 2023-03-01 DIAGNOSIS — Z09 Encounter for follow-up examination after completed treatment for conditions other than malignant neoplasm: Secondary | ICD-10-CM

## 2023-03-01 NOTE — Progress Notes (Unsigned)
        Established patient visit  History, exam, impression, and plan:  No problem-specific Assessment & Plan notes found for this encounter.   ROS  Physical Exam  Procedures performed this visit: None.  No follow-ups on file.  __________________________________ Thayer Ohm, DNP, APRN, FNP-BC Primary Care and Sports Medicine [facility]

## 2023-03-02 ENCOUNTER — Encounter: Payer: Self-pay | Admitting: Medical-Surgical

## 2023-03-09 ENCOUNTER — Other Ambulatory Visit (HOSPITAL_COMMUNITY): Payer: Self-pay

## 2023-03-09 ENCOUNTER — Other Ambulatory Visit: Payer: Self-pay | Admitting: Family Medicine

## 2023-03-09 DIAGNOSIS — J302 Other seasonal allergic rhinitis: Secondary | ICD-10-CM

## 2023-03-09 MED ORDER — LEVOCETIRIZINE DIHYDROCHLORIDE 5 MG PO TABS
5.0000 mg | ORAL_TABLET | Freq: Every evening | ORAL | 3 refills | Status: DC
  Filled 2023-03-09: qty 30, 30d supply, fill #0
  Filled 2023-04-04 – 2023-04-10 (×2): qty 30, 30d supply, fill #1
  Filled 2023-05-08: qty 30, 30d supply, fill #2

## 2023-03-21 ENCOUNTER — Ambulatory Visit (INDEPENDENT_AMBULATORY_CARE_PROVIDER_SITE_OTHER): Admitting: Medical-Surgical

## 2023-03-21 VITALS — BP 101/63 | HR 81 | Temp 98.6°F | Resp 20 | Ht 64.5 in | Wt 180.0 lb

## 2023-03-21 DIAGNOSIS — U071 COVID-19: Secondary | ICD-10-CM | POA: Diagnosis not present

## 2023-03-21 LAB — POCT INFLUENZA A/B
Influenza A, POC: NEGATIVE
Influenza B, POC: NEGATIVE

## 2023-03-21 LAB — POCT RAPID STREP A (OFFICE): Rapid Strep A Screen: NEGATIVE

## 2023-03-21 LAB — POC COVID19 BINAXNOW: SARS Coronavirus 2 Ag: POSITIVE — AB

## 2023-03-21 MED ORDER — HYDROCOD POLI-CHLORPHE POLI ER 10-8 MG/5ML PO SUER: 5.0000 mL | Freq: Two times a day (BID) | ORAL | 0 refills | Status: DC | PRN

## 2023-03-21 NOTE — Progress Notes (Unsigned)
 Subjective:  Patient ID: Rhonda Rojas, female    DOB: 07-22-1990, 33 y.o.   MRN: [id number]  Patient Care Team: Christen Butter, NP as PCP - General (Nurse Practitioner) [name], MD (Inactive) as Consulting Physician (Dermatology) Glyn Ade, PA-C as Physician Assistant (Dermatology)   Chief Complaint:  Nasal Congestion, Sore Throat, and Cough   HPI:  Rhonda Rojas is a 33 y.o. female presenting on 03/21/2023 for Nasal Congestion, Sore Throat, and Cough   History, Exam,  Impression and Plan  Lab test positive for detection of COVID-19 virus Presents with 3 day history of cough, sore throat, and nasal congestion. Positive for POCT COVID this visit.  Reports that her children have been sick as well. Taking NyQuil with minimal effect. Asks if it is ok to use flonase that she bought today but had not used. Reports that she got minimal sleep last night because of postnasal drainage and cough.  Denies fevers. Positive for mild myalgia in shoulders and back. POCT strep negative and influenza A/B negative. On exam pharynx is slightly red without edema. Negative cervical  lymph edema. Positive Tenderness over maxillary sinuses  Right TM is occluded by cerumen plug.  Left TM with yellow fluid line across bottom third of TM.  Negative tenderness to auricle or tragus of ears bilateral.  Lungs are clear but elicited cough with auscultation and deep breathing.  Cough is nonproductive.  Heart rhythm is regular S1-S2 heard without murmur or gallop.  Uncomplicated COVID infection.  Patient offered but was not interested in remdesivir or Paxlovid.  Patient asks for something to help her sleep with cough. Tussionex ordered. -     POCT Influenza A/B -     POCT rapid strep A -     POC COVID-19 -     Start chlorpheniramine-HYDROcodone (TUSSIONEX) 10-8 MG/5ML; Take 5 mLs by mouth every 12 (twelve) hours as needed for cough (cough, will cause drowsiness.). - Recommend OTC Flonase 27mcg/ACT.  2 sprays per each nare daily    Per CDC guidance patient is to self isolate for 5 days from beginning of symptoms.  Then if no fever and feeling better patient may resume normal activity and work while wearing a mask.   Continue all other maintenance medications.  Follow up plan: Return if symptoms worsen or fail to improve in 7 days.   Relevant past medical, surgical, family, and social history reviewed and updated as indicated.  Allergies and medications reviewed and updated. Data reviewed: Chart in Epic.   Past Medical History:  Diagnosis Date   Fatigue    Vitamin D deficiency     Past Surgical History:  Procedure Laterality Date   TONSILLECTOMY      Social History   Socioeconomic History   Marital status: Married    Spouse name: Not on file   Number of children: Not on file   Years of education: Not on file   Highest education level: 12th grade  Occupational History   Not on file  Tobacco Use   Smoking status: Never   Smokeless tobacco: Never  Vaping Use   Vaping status: Never Used  Substance and Sexual Activity   Alcohol use: Yes    Comment: Occasionally   Drug use: Never   Sexual activity: Yes    Partners: Male  Other Topics Concern   Not on file  Social History Narrative   Not on file   Social Drivers of Corporate investment banker  Strain: Low Risk  (03/21/2023)   Overall Financial Resource Strain (CARDIA)    Difficulty of Paying Living Expenses: Not very hard  Food Insecurity: No Food Insecurity (03/21/2023)   Hunger Vital Sign    Worried About Running Out of Food in the Last Year: Never true    Ran Out of Food in the Last Year: Never true  Transportation Needs: No Transportation Needs (03/21/2023)   PRAPARE - Administrator, Civil Service (Medical): No    Lack of Transportation (Non-Medical): No  Physical Activity: Insufficiently Active (03/21/2023)   Exercise Vital Sign    Days of Exercise per Week: 3 days    Minutes of Exercise  per Session: 20 min  Stress: No Stress Concern Present (03/21/2023)   [employer] of Occupational Health - Occupational Stress Questionnaire    Feeling of Stress : Only a little  Social Connections: Moderately Integrated (03/21/2023)   Social Connection and Isolation Panel [NHANES]    Frequency of Communication with Friends and Family: Three times a week    Frequency of Social Gatherings with Friends and Family: Once a week    Attends Religious Services: 1 to 4 times per year    Active Member of [employer] or Organizations: No    Attends Engineer, structural: Not on file    Marital Status: Married  Catering manager Violence: Not At Risk (02/25/2023)   Received from [facility]   HITS    Over the last 12 months how often did your partner physically hurt you?: Never    Over the last 12 months how often did your partner insult you or talk down to you?: Never    Over the last 12 months how often did your partner threaten you with physical harm?: Never    Over the last 12 months how often did your partner scream or curse at you?: Never    Outpatient Encounter Medications as of 03/21/2023  Medication Sig   chlorpheniramine-HYDROcodone (TUSSIONEX) 10-8 MG/5ML Take 5 mLs by mouth every 12 (twelve) hours as needed for cough (cough, will cause drowsiness.).   desogestrel-ethinyl estradiol (VOLNEA) 0.15-0.02/0.01 MG (21/5) tablet Take 1 tablet by mouth daily.   levocetirizine (XYZAL) 5 MG tablet Take 1 tablet (5 mg total) by mouth every evening.   Multiple Vitamin (MULTIVITAMIN ADULT PO) Take by mouth.   [DISCONTINUED] cephALEXin (KEFLEX) 500 MG capsule Take 500 mg by mouth 4 (four) times daily.   [DISCONTINUED] famotidine (PEPCID) 20 MG tablet Take 20 mg by mouth 2 (two) times daily.   [DISCONTINUED] fluconazole (DIFLUCAN) 150 MG tablet Take 1 tablet (150 mg total) by mouth daily.   No facility-administered encounter medications on file as of 03/21/2023.    No Known Allergies  Review  of Systems      Objective:  BP 101/63 (BP Location: Left Arm, Cuff Size: Normal)   Pulse 81   Temp 98.6 F (37 C) (Oral)   Resp 20   Ht 5' 4.5" (1.638 m)   Wt 180 lb (81.6 kg)   SpO2 98%   BMI 30.42 kg/m    Wt Readings from Last 3 Encounters:  03/21/23 180 lb (81.6 kg)  03/01/23 181 lb 12.8 oz (82.5 kg)  10/12/22 179 lb 3.2 oz (81.3 kg)    Physical Exam  Results for orders placed or performed in visit on 03/21/23  POCT Influenza A/B   Collection Time: 03/21/23 11:40 AM  Result Value Ref Range   Influenza A, POC Negative Negative  Influenza B, POC Negative Negative  POCT rapid strep A   Collection Time: 03/21/23 11:40 AM  Result Value Ref Range   Rapid Strep A Screen Negative Negative  POC COVID-19   Collection Time: 03/21/23 11:40 AM  Result Value Ref Range   SARS Coronavirus 2 Ag Positive (A) Negative       Pertinent labs & imaging results that were available during my care of the patient were reviewed by me and considered in my medical decision making.   Continue healthy lifestyle choices, including diet (rich in fruits, vegetables, and lean proteins, and low in salt and simple carbohydrates) and exercise (at least 30 minutes of moderate physical activity daily).  The above assessment and management plan was discussed with the patient. The patient verbalized understanding of and has agreed to the management plan. Patient is aware to call the clinic if they develop any new symptoms or if symptoms persist or worsen. Patient is aware when to return to the clinic for a follow-up visit. Patient educated on when it is appropriate to go to the emergency department.   Maryelizabeth Kaufmann Student AGNP

## 2023-03-22 NOTE — Progress Notes (Signed)
 Medical screening examination/treatment was performed by qualified clinical staff member and as supervising provider I was immediately available for consultation/collaboration. I have reviewed documentation and agree with assessment and plan.  Thayer Ohm, DNP, APRN, FNP-BC [facility] and Sports Medicine

## 2023-04-04 ENCOUNTER — Other Ambulatory Visit: Payer: Self-pay

## 2023-04-04 ENCOUNTER — Encounter: Payer: Self-pay | Admitting: Pharmacist

## 2023-04-07 ENCOUNTER — Other Ambulatory Visit: Payer: Self-pay

## 2023-10-17 ENCOUNTER — Other Ambulatory Visit: Payer: Self-pay | Admitting: Medical-Surgical

## 2023-10-17 DIAGNOSIS — Z3041 Encounter for surveillance of contraceptive pills: Secondary | ICD-10-CM

## 2023-10-18 ENCOUNTER — Other Ambulatory Visit (HOSPITAL_COMMUNITY): Payer: Self-pay

## 2023-10-18 ENCOUNTER — Other Ambulatory Visit: Payer: Self-pay

## 2023-10-18 MED ORDER — DESOGESTREL-ETHINYL ESTRADIOL 0.15-0.02/0.01 MG (21/5) PO TABS
1.0000 | ORAL_TABLET | Freq: Every day | ORAL | 0 refills | Status: DC
  Filled 2023-10-18: qty 84, 84d supply, fill #0

## 2023-11-23 ENCOUNTER — Other Ambulatory Visit: Payer: Self-pay | Admitting: Medical-Surgical

## 2023-11-23 DIAGNOSIS — Z3041 Encounter for surveillance of contraceptive pills: Secondary | ICD-10-CM

## 2023-11-24 NOTE — Telephone Encounter (Signed)
 Last filled 10/18/2023  Patient misplaced the other 2 packs.

## 2023-11-25 ENCOUNTER — Other Ambulatory Visit: Payer: Self-pay

## 2023-11-25 ENCOUNTER — Other Ambulatory Visit (HOSPITAL_COMMUNITY): Payer: Self-pay

## 2023-11-25 MED ORDER — DESOGESTREL-ETHINYL ESTRADIOL 0.15-0.02/0.01 MG (21/5) PO TABS
1.0000 | ORAL_TABLET | Freq: Every day | ORAL | 0 refills | Status: DC
  Filled 2023-11-25: qty 84, 84d supply, fill #0

## 2023-11-30 ENCOUNTER — Other Ambulatory Visit: Payer: Self-pay

## 2024-01-26 ENCOUNTER — Encounter: Admitting: Dietician

## 2024-01-26 ENCOUNTER — Ambulatory Visit (INDEPENDENT_AMBULATORY_CARE_PROVIDER_SITE_OTHER): Admitting: Medical-Surgical

## 2024-01-26 ENCOUNTER — Encounter: Payer: Self-pay | Admitting: Medical-Surgical

## 2024-01-26 VITALS — BP 105/68 | HR 79 | Resp 20 | Ht 64.5 in | Wt 188.0 lb

## 2024-01-26 DIAGNOSIS — Z6831 Body mass index (BMI) 31.0-31.9, adult: Secondary | ICD-10-CM | POA: Diagnosis not present

## 2024-01-26 DIAGNOSIS — Z0000 Encounter for general adult medical examination without abnormal findings: Secondary | ICD-10-CM | POA: Diagnosis not present

## 2024-01-26 DIAGNOSIS — E66811 Obesity, class 1: Secondary | ICD-10-CM | POA: Diagnosis not present

## 2024-01-26 DIAGNOSIS — E6609 Other obesity due to excess calories: Secondary | ICD-10-CM | POA: Diagnosis not present

## 2024-01-26 NOTE — Patient Instructions (Signed)

## 2024-01-26 NOTE — Progress Notes (Signed)
 "  Complete physical exam  Patient: Rhonda Rojas   DOB: [birth year]   34 y.o. Female  MRN: [id number]  Subjective:    Chief Complaint  Patient presents with   Annual Exam    Rhonda Rojas is a 34 y.o. female who presents today for a complete physical exam. She reports consuming a general diet. Cardio , strength training, and HIIT exercises 5 days weekly. She generally feels well. She reports sleeping fairly well. She does not have additional problems to discuss today.    Most recent fall risk assessment:    01/26/2024    1:37 PM  Fall Risk   Falls in the past year? 0  Number falls in past yr: 0  Injury with Fall? 0  Risk for fall due to : No Fall Risks  Follow up Falls evaluation completed     Most recent depression screenings:    01/26/2024    1:37 PM 04/22/2022    2:40 PM  PHQ 2/9 Scores  PHQ - 2 Score 0 0  PHQ- 9 Score 4     Vision:Within last year and Dental: No current dental problems and Receives regular dental care    Patient Care Team: Willo Mini, NP as PCP - General (Nurse Practitioner) Livingston Rigg, MD as Consulting Physician (Dermatology) [place], Andrez SAUNDERS, PA-C (Inactive) as Physician Assistant (Dermatology)   Show/hide medication list[1]  Review of Systems  Constitutional:  Negative for chills, fever, malaise/fatigue and weight loss.  HENT:  Negative for congestion, ear pain, hearing loss, sinus pain and sore throat.   Eyes:  Negative for blurred vision, photophobia and pain.  Respiratory:  Negative for cough, shortness of breath and wheezing.   Cardiovascular:  Negative for chest pain, palpitations and leg swelling.  Gastrointestinal:  Negative for abdominal pain, constipation, diarrhea, heartburn, nausea and vomiting.  Genitourinary:  Negative for dysuria, frequency and urgency.  Musculoskeletal:  Negative for falls and neck pain.  Skin:  Negative for itching and rash.  Neurological:  Negative for dizziness, weakness and  headaches.  Endo/Heme/Allergies:  Negative for polydipsia. Does not bruise/bleed easily.  Psychiatric/Behavioral:  Negative for depression, substance abuse and suicidal ideas. The patient has insomnia (trouble staying asleep). The patient is not nervous/anxious.      Objective:    BP 105/68 (BP Location: Right Arm, Cuff Size: Normal)   Pulse 79   Resp 20   Ht 5' 4.5 (1.638 m)   Wt 188 lb (85.3 kg)   SpO2 100%   BMI 31.77 kg/m    Physical Exam Constitutional:      General: She is not in acute distress.    Appearance: Normal appearance. She is not ill-appearing.  HENT:     Head: Normocephalic and atraumatic.     Right Ear: Tympanic membrane, ear canal and external ear normal. There is no impacted cerumen.     Left Ear: Tympanic membrane, ear canal and external ear normal. There is no impacted cerumen.     Nose: Nose normal. No congestion or rhinorrhea.     Mouth/Throat:     Mouth: Mucous membranes are moist.     Pharynx: No oropharyngeal exudate or posterior oropharyngeal erythema.  Eyes:     General: No scleral icterus.       Right eye: No discharge.        Left eye: No discharge.     Extraocular Movements: Extraocular movements intact.     Conjunctiva/sclera: Conjunctivae normal.     Pupils: Pupils  are equal, round, and reactive to light.  Neck:     Thyroid: No thyromegaly.     Vascular: No carotid bruit or JVD.     Trachea: Trachea normal.  Cardiovascular:     Rate and Rhythm: Normal rate and regular rhythm.     Pulses: Normal pulses.     Heart sounds: Normal heart sounds. No murmur heard.    No friction rub. No gallop.  Pulmonary:     Effort: Pulmonary effort is normal. No respiratory distress.     Breath sounds: Normal breath sounds. No wheezing.  Abdominal:     General: Bowel sounds are normal. There is no distension.     Palpations: Abdomen is soft.     Tenderness: There is no abdominal tenderness. There is no guarding.  Musculoskeletal:        General:  Normal range of motion.     Cervical back: Normal range of motion and neck supple.  Lymphadenopathy:     Cervical: No cervical adenopathy.  Skin:    General: Skin is warm and dry.  Neurological:     Mental Status: She is alert and oriented to person, place, and time.     Cranial Nerves: No cranial nerve deficit.  Psychiatric:        Mood and Affect: Mood normal.        Behavior: Behavior normal.        Thought Content: Thought content normal.        Judgment: Judgment normal.   No results found for any visits on 01/26/24.     Assessment & Plan:    Routine Health Maintenance and Physical Exam  Immunization History  Administered Date(s) Administered   DTaP 09/01/1989, 10/31/1989, 01/02/1990, 07/10/1991, 08/18/1993   HPV 9-valent 04/07/2006, 11/20/2007   Hepatitis A, Ped/Adol-2 Dose 04/07/2006, 11/20/2007   IPV 1990-10[birth year]01, 09/01/1989, 10/31/1989, 08/18/1994   Influenza, Seasonal, Injecte, Preservative Fre 10/12/2022   Influenza,inj,Quad PF,6+ Mos 09/22/2018   Influenza-Unspecified 10/26/2023   MMR 08/18/1994, 10/20/1994   Meningococcal Conjugate 04/07/2006   PFIZER(Purple Top)SARS-COV-2 Vaccination 02/19/2019, 03/12/2019, 11/16/2019   Pfizer Covid-19 Vaccine Bivalent Booster 51yrs & up 01/24/2019, 02/14/2019, 11/16/2019   Tdap 10/12/2022    Health Maintenance  Topic Date Due   HPV VACCINES (3 - 3-dose series) 02/12/2008   Hepatitis B Vaccines 19-59 Average Risk (1 of 3 - 19+ 3-dose series) Never done   COVID-19 Vaccine (6 - 2025-26 season) 02/11/2024 (Originally 09/05/2023)   Cervical Cancer Screening (HPV/Pap Cotest)  09/25/2026   DTaP/Tdap/Td (7 - Td or Tdap) 10/11/2032   Influenza Vaccine  Completed   Hepatitis C Screening  Completed   HIV Screening  Completed   Meningococcal B Vaccine  Aged Out   Pneumococcal Vaccine  Discontinued    Discussed health benefits of physical activity, and encouraged her to engage in regular exercise appropriate for her age and  condition.  1. Annual physical exam (Primary) Checking labs as below. UTD on preventative care. Wellness information provided with AVS. - CMP14+EGFR - Lipid panel - CBC with Differential/Platelet  2. Class 1 obesity due to excess calories without serious comorbidity with body mass index (BMI) of 31.0 to 31.9 in adult Checking A1c and TSH.  - Hemoglobin A1c - TSH  Return in about 1 year (around 01/25/2025) for annual physical exam.   Zada Palin, NP      [1]  Outpatient Medications Prior to Visit  Medication Sig   Melatonin 10 MG CAPS Take by mouth.   Multiple  Vitamin (MULTIVITAMIN ADULT PO) Take by mouth.   [DISCONTINUED] chlorpheniramine-HYDROcodone (TUSSIONEX) 10-8 MG/5ML Take 5 mLs by mouth every 12 (twelve) hours as needed for cough (cough, will cause drowsiness.).   [DISCONTINUED] desogestrel -ethinyl estradiol  (PIMTREA ) 0.15-0.02/0.01 MG (21/5) tablet Take 1 tablet by mouth daily.   [DISCONTINUED] levocetirizine (XYZAL ) 5 MG tablet Take 1 tablet (5 mg total) by mouth every evening.   No facility-administered medications prior to visit.   "

## 2024-01-27 ENCOUNTER — Ambulatory Visit: Payer: Self-pay | Admitting: Medical-Surgical

## 2024-01-27 LAB — CMP14+EGFR
ALT: 43 IU/L — ABNORMAL HIGH (ref 0–32)
AST: 23 IU/L (ref 0–40)
Albumin: 4.5 g/dL (ref 3.9–4.9)
Alkaline Phosphatase: 46 IU/L (ref 41–116)
BUN/Creatinine Ratio: 15 (ref 9–23)
BUN: 9 mg/dL (ref 6–20)
Bilirubin Total: 0.4 mg/dL (ref 0.0–1.2)
CO2: 23 mmol/L (ref 20–29)
Calcium: 9.1 mg/dL (ref 8.7–10.2)
Chloride: 102 mmol/L (ref 96–106)
Creatinine, Ser: 0.62 mg/dL (ref 0.57–1.00)
Globulin, Total: 2.1 g/dL (ref 1.5–4.5)
Glucose: 90 mg/dL (ref 70–99)
Potassium: 4.4 mmol/L (ref 3.5–5.2)
Sodium: 139 mmol/L (ref 134–144)
Total Protein: 6.6 g/dL (ref 6.0–8.5)
eGFR: 120 mL/min/1.73

## 2024-01-27 LAB — CBC WITH DIFFERENTIAL/PLATELET
Basophils Absolute: 0 x10E3/uL (ref 0.0–0.2)
Basos: 0 %
EOS (ABSOLUTE): 0.3 x10E3/uL (ref 0.0–0.4)
Eos: 4 %
Hematocrit: 34.2 % (ref 34.0–46.6)
Hemoglobin: 11.9 g/dL (ref 11.1–15.9)
Immature Grans (Abs): 0 x10E3/uL (ref 0.0–0.1)
Immature Granulocytes: 0 %
Lymphocytes Absolute: 2.3 x10E3/uL (ref 0.7–3.1)
Lymphs: 28 %
MCH: 31.2 pg (ref 26.6–33.0)
MCHC: 34.8 g/dL (ref 31.5–35.7)
MCV: 90 fL (ref 79–97)
Monocytes Absolute: 0.6 x10E3/uL (ref 0.1–0.9)
Monocytes: 7 %
Neutrophils Absolute: 5 x10E3/uL (ref 1.4–7.0)
Neutrophils: 61 %
Platelets: 233 x10E3/uL (ref 150–450)
RBC: 3.82 x10E6/uL (ref 3.77–5.28)
RDW: 13 % (ref 11.7–15.4)
WBC: 8.2 x10E3/uL (ref 3.4–10.8)

## 2024-01-27 LAB — LIPID PANEL
Chol/HDL Ratio: 3.9 ratio (ref 0.0–4.4)
Cholesterol, Total: 149 mg/dL (ref 100–199)
HDL: 38 mg/dL — ABNORMAL LOW
LDL Chol Calc (NIH): 76 mg/dL (ref 0–99)
Triglycerides: 211 mg/dL — ABNORMAL HIGH (ref 0–149)
VLDL Cholesterol Cal: 35 mg/dL (ref 5–40)

## 2024-01-27 LAB — HEMOGLOBIN A1C
Est. average glucose Bld gHb Est-mCnc: 88 mg/dL
Hgb A1c MFr Bld: 4.7 % — ABNORMAL LOW (ref 4.8–5.6)

## 2024-01-27 LAB — TSH: TSH: 2.11 u[IU]/mL (ref 0.450–4.500)

## 2025-01-28 ENCOUNTER — Encounter: Admitting: Medical-Surgical
